# Patient Record
Sex: Female | Born: 1955 | Race: Black or African American | Hispanic: No | Marital: Married | Smoking: Former smoker
Health system: Southern US, Community
[De-identification: ages and names within clinical notes are randomized; demographics above are authoritative.]

## PROBLEM LIST (undated history)

## (undated) DIAGNOSIS — I10 Essential (primary) hypertension: Secondary | ICD-10-CM

## (undated) DIAGNOSIS — D649 Anemia, unspecified: Secondary | ICD-10-CM

## (undated) DIAGNOSIS — E559 Vitamin D deficiency, unspecified: Secondary | ICD-10-CM

## (undated) DIAGNOSIS — M549 Dorsalgia, unspecified: Secondary | ICD-10-CM

## (undated) HISTORY — DX: Essential (primary) hypertension: I10

## (undated) HISTORY — PX: NO PAST SURGERIES: SHX2092

## (undated) HISTORY — DX: Dorsalgia, unspecified: M54.9

## (undated) HISTORY — DX: Anemia, unspecified: D64.9

## (undated) HISTORY — DX: Vitamin D deficiency, unspecified: E55.9

---

## 2011-09-16 ENCOUNTER — Emergency Department: Payer: Self-pay | Admitting: Emergency Medicine
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
## 2019-01-15 ENCOUNTER — Emergency Department
Admission: EM | Admit: 2019-01-15 | Discharge: 2019-01-15 | Disposition: A | Discharge status: Home / Self Care | Payer: Self-pay | Attending: Emergency Medicine | Admitting: Emergency Medicine

## 2019-01-15 ENCOUNTER — Other Ambulatory Visit: Payer: Self-pay

## 2019-01-15 ENCOUNTER — Encounter: Payer: Self-pay | Admitting: Emergency Medicine

## 2019-01-15 DIAGNOSIS — L2489 Irritant contact dermatitis due to other agents: Secondary | ICD-10-CM | POA: Insufficient documentation

## 2019-01-15 MED ORDER — MUPIROCIN 2 % EX OINT: TOPICAL_OINTMENT | CUTANEOUS | 0 refills | Status: AC

## 2019-01-15 MED ORDER — PREDNISONE 10 MG (21) PO TBPK: ORAL_TABLET | ORAL | 0 refills | Status: DC

## 2019-01-15 NOTE — ED Triage Notes (Signed)
Pt reports itching and burning to both of her eyes for a week. Pt reports was using a lotion for the irritation as recommenced by the pharmacy but not helping

## 2019-01-15 NOTE — ED Provider Notes (Signed)
[facility] Emergency Department Provider Note  ____________________________________________  Time seen: Approximately 4:27 PM  I have reviewed the triage vital signs and the nursing notes.   HISTORY  Chief Complaint Eye Drainage   HPI Stephanie Marsh is a 63 y.o. female presents to the ER for itching and burning around both eyes for the past week. Symptoms started after wearing a mask while at work. Area is itching and red. No vision changes or drainage from eyes. No fever or other symptoms of concern.    History reviewed. No pertinent past medical history.  There are no problems to display for this patient.   History reviewed. No pertinent surgical history.  Prior to Admission medications   Medication Sig Start Date End Date Taking? Authorizing Provider  mupirocin ointment (BACTROBAN) 2 % Apply to affected area 3 times daily 01/15/19 01/15/20  Jamise Pentland B, FNP  predniSONE (STERAPRED UNI-PAK 21 TAB) 10 MG (21) TBPK tablet Take 6 tablets on the first day and decrease by 1 tablet each day until finished. 01/15/19   Chinita Pester, FNP    Allergies Patient has no known allergies.  No family history on file.  Social History Social History   Tobacco Use  . Smoking status: Not on file  Substance Use Topics  . Alcohol use: Not on file  . Drug use: Not on file    Review of Systems  Constitutional: Negative for fever. Respiratory: Negative for cough or shortness of breath.  Musculoskeletal: Negative for myalgias Skin: Positive for facial erythema. Neurological: Negative for numbness or paresthesias. ____________________________________________   PHYSICAL EXAM:  VITAL SIGNS: ED Triage Vitals  Enc Vitals Group     BP 01/15/19 1400 (!) 176/82     Pulse Rate 01/15/19 1400 73     Resp 01/15/19 1400 16     Temp 01/15/19 1400 98.4 F (36.9 C)     Temp Source 01/15/19 1400 Oral     SpO2 01/15/19 1400 99 %     Weight 01/15/19 1347 160 lb  (72.6 kg)     Height 01/15/19 1347 4\' 9"  (1.448 m)     Head Circumference --      Peak Flow --      Pain Score 01/15/19 1347 0     Pain Loc --      Pain Edu? --      Excl. in GC? --      Constitutional: Overall well appearing. Eyes: Conjunctivae are clear without discharge or drainage. Nose: No rhinorrhea noted. Mouth/Throat: Airway is patent.  Neck: No stridor. Unrestricted range of motion observed. Cardiovascular: Capillary refill is <3 seconds.  Respiratory: Respirations are even and unlabored.. Musculoskeletal: Unrestricted range of motion observed. Neurologic: Awake, alert, and oriented x 4.  Skin:  Skin erythema noted around eyes and in masked area of face.  ____________________________________________   LABS (all labs ordered are listed, but only abnormal results are displayed)  Labs Reviewed - No data to display ____________________________________________  EKG  Not indicated. ____________________________________________  RADIOLOGY  Not indicated. ____________________________________________   PROCEDURES  Procedures ____________________________________________   INITIAL IMPRESSION / ASSESSMENT AND PLAN / ED COURSE  SYDNEY HASTEN is a 63 y.o. female who presents to the emergency department for treatment and evaluation of facial erythema and skin irritation. She has been using Aquaphor without relief. Exam is concerning for contact dermatitis. She will be prescribed prednisone and mupirocin ointment. She is to see the [facility] faculty clinic if not improving over  the next few days. If symptoms worsen, she it to return to the ER.   Medications - No data to display   Pertinent labs & imaging results that were available during my care of the patient were reviewed by me and considered in my medical decision making (see chart for details).  ____________________________________________   FINAL CLINICAL IMPRESSION(S) / ED DIAGNOSES  Final diagnoses:  Irritant  contact dermatitis due to other agents    ED Discharge Orders         Ordered    predniSONE (STERAPRED UNI-PAK 21 TAB) 10 MG (21) TBPK tablet     01/15/19 1523    mupirocin ointment (BACTROBAN) 2 %     01/15/19 1523           Note:  This document was prepared using Dragon voice recognition software and may include unintentional dictation errors.   Victorino Dike, FNP 01/15/19 5329    Lavonia Drafts, MD 01/16/19 [phone]

## 2019-01-15 NOTE — ED Notes (Signed)
Pt seen in room 3 of [facility].  Discharge from [facility].

## 2019-01-15 NOTE — Discharge Instructions (Signed)
Please see primary care or return to the ER for any sign or concern of infection.  It appears that the skin irritation is related to wearing a face mask. If possible, please wear shield instead.

## 2019-02-08 ENCOUNTER — Emergency Department
Admission: EM | Admit: 2019-02-08 | Discharge: 2019-02-08 | Disposition: A | Discharge status: Home / Self Care | Payer: BC Managed Care – PPO | Attending: Emergency Medicine | Admitting: Emergency Medicine

## 2019-02-08 ENCOUNTER — Other Ambulatory Visit: Payer: Self-pay

## 2019-02-08 DIAGNOSIS — Z79899 Other long term (current) drug therapy: Secondary | ICD-10-CM | POA: Diagnosis not present

## 2019-02-08 DIAGNOSIS — R21 Rash and other nonspecific skin eruption: Secondary | ICD-10-CM | POA: Diagnosis present

## 2019-02-08 DIAGNOSIS — L249 Irritant contact dermatitis, unspecified cause: Secondary | ICD-10-CM | POA: Diagnosis not present

## 2019-02-08 MED ORDER — PREDNISONE 10 MG PO TABS: ORAL_TABLET | ORAL | 0 refills | Status: DC

## 2019-02-08 NOTE — ED Provider Notes (Signed)
[facility] Emergency Department Provider Note  ____________________________________________  Time seen: Approximately 6:35 PM  I have reviewed the triage vital signs and the nursing notes.   HISTORY  Chief Complaint Rash    HPI Stephanie Marsh is a 64 y.o. female that presents to the emergency department for evaluation of rash to cheeks and around eyes for several days. Rash itches and burns.  No fevers.  Patient states that rash started after she started wearing cloth masks at her second job. She does not seem to have a problem with the paper masks, but she is required to wear these cloth masks.  She bought some clips at [facility] today to help keep the mask off of her skin.   At her first job she is able to wear glasses with a face shield.  She also wears reading glasses daily, which do not touch her face and she has had these glasses for several years.  Her face shield does not touch her face either. Patient had the same rash in December and was evaluated in the emergency department.  She was given a prescription for prednisone and new suppressant ointment and rash cleared.  She finished the prednisone on January 4.    History reviewed. No pertinent past medical history.  There are no problems to display for this patient.   History reviewed. No pertinent surgical history.  Prior to Admission medications   Medication Sig Start Date End Date Taking? Authorizing Provider  mupirocin ointment (BACTROBAN) 2 % Apply to affected area 3 times daily 01/15/19 01/15/20  Triplett, Rulon Eisenmenger B, FNP  predniSONE (DELTASONE) 10 MG tablet Take 6 tablets on day 1, take 5 tablets on day 2, take 4 tablets on day 3, take 3 tablets on day 4, take 2 tablets on day 5, take 1 tablet on day 6 02/08/19   Enid Derry, PA-C    Allergies Patient has no known allergies.  No family history on file.  Social History Social History   Tobacco Use  . Smoking status: Never Smoker  . Smokeless  tobacco: Never Used  Substance Use Topics  . Alcohol use: Never  . Drug use: Never     Review of Systems  Constitutional: No fever/chills Gastrointestinal:  No nausea, no vomiting.  Musculoskeletal: Negative for musculoskeletal pain. Skin: Negative for abrasions, lacerations, ecchymosis. Positive for rash. Neurological: Negative for headaches   ____________________________________________   PHYSICAL EXAM:  VITAL SIGNS: ED Triage Vitals  Enc Vitals Group     BP 02/08/19 1711 (!) 142/70     Pulse Rate 02/08/19 1711 77     Resp 02/08/19 1711 18     Temp 02/08/19 1711 98.6 F (37 C)     Temp Source 02/08/19 1711 Oral     SpO2 02/08/19 1711 97 %     Weight 02/08/19 1710 160 lb (72.6 kg)     Height 02/08/19 1710 4' 10.5" (1.486 m)     Head Circumference --      Peak Flow --      Pain Score 02/08/19 1733 0     Pain Loc --      Pain Edu? --      Excl. in GC? --      Constitutional: Alert and oriented. Well appearing and in no acute distress. Eyes: Conjunctivae are normal. PERRL. EOMI. Head: Atraumatic. ENT:      Ears:      Nose: No congestion/rhinnorhea.      Mouth/Throat: Mucous membranes  are moist.  Neck: No stridor.   Cardiovascular: Normal rate, regular rhythm.  Good peripheral circulation. Respiratory: Normal respiratory effort without tachypnea or retractions. Lungs CTAB. Good air entry to the bases with no decreased or absent breath sounds. Musculoskeletal: Full range of motion to all extremities. No gross deformities appreciated. Neurologic:  Normal speech and language. No gross focal neurologic deficits are appreciated.  Skin:  Skin is warm, dry and intact.  Erythema and dry skin to bilateral cheeks that extend around orbits.  No swelling. Psychiatric: Mood and affect are normal. Speech and behavior are normal. Patient exhibits appropriate insight and judgement.   ____________________________________________   LABS (all labs ordered are listed, but only  abnormal results are displayed)  Labs Reviewed - No data to display ____________________________________________  EKG   ____________________________________________  RADIOLOGY  No results found.  ____________________________________________    PROCEDURES  Procedure(s) performed:    Procedures    Medications - No data to display   ____________________________________________   INITIAL IMPRESSION / ASSESSMENT AND PLAN / ED COURSE  Pertinent labs & imaging results that were available during my care of the patient were reviewed by me and considered in my medical decision making (see chart for details).  Review of the [place] CSRS was performed in accordance of the [place] prior to dispensing any controlled drugs.  Patient's diagnosis is consistent with irritant dermatitis. Patient will be discharged home with prescriptions for prednisone. Patient is to follow up with dermatology as directed. Referral was given. Patient is given ED precautions to return to the ED for any worsening or new symptoms.  Stephanie Marsh was evaluated in Emergency Department on 02/08/2019 for the symptoms described in the history of present illness. She was evaluated in the context of the global COVID-19 pandemic, which necessitated consideration that the patient might be at risk for infection with the SARS-CoV-2 virus that causes COVID-19. Institutional protocols and algorithms that pertain to the evaluation of patients at risk for COVID-19 are in a state of rapid change based on information released by regulatory bodies including the CDC and federal and state organizations. These policies and algorithms were followed during the patient's care in the ED.  ____________________________________________  FINAL CLINICAL IMPRESSION(S) / ED DIAGNOSES  Final diagnoses:  Irritant dermatitis      NEW MEDICATIONS STARTED DURING THIS VISIT:  ED Discharge Orders         Ordered    predniSONE (DELTASONE) 10 MG  tablet     02/08/19 1851              This chart was dictated using voice recognition software/Dragon. Despite best efforts to proofread, errors can occur which can change the meaning. Any change was purely unintentional.    Laban Emperor, PA-C 02/08/19 1954    Carrie Mew, MD 02/08/19 [phone]

## 2019-02-08 NOTE — ED Triage Notes (Signed)
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

## 2019-07-30 ENCOUNTER — Other Ambulatory Visit: Payer: Self-pay

## 2019-07-30 ENCOUNTER — Ambulatory Visit: Payer: BC Managed Care – PPO | Admitting: Dermatology

## 2019-07-30 DIAGNOSIS — L280 Lichen simplex chronicus: Secondary | ICD-10-CM | POA: Diagnosis not present

## 2019-07-30 DIAGNOSIS — L209 Atopic dermatitis, unspecified: Secondary | ICD-10-CM | POA: Diagnosis not present

## 2019-07-30 DIAGNOSIS — L818 Other specified disorders of pigmentation: Secondary | ICD-10-CM | POA: Diagnosis not present

## 2019-07-30 DIAGNOSIS — R21 Rash and other nonspecific skin eruption: Secondary | ICD-10-CM

## 2019-07-30 DIAGNOSIS — L811 Chloasma: Secondary | ICD-10-CM

## 2019-07-30 MED ORDER — MOMETASONE FUROATE 0.1 % EX CREA: TOPICAL_CREAM | CUTANEOUS | 0 refills | Status: DC

## 2019-07-30 NOTE — Progress Notes (Signed)
   New Patient Visit  Subjective  Stephanie Marsh is a 64 y.o. female who presents for the following: rash and discoloration (Irritation of the eyebrows and the face that occurred after having to wear a facial mask about a year ago ). Patient using Benadryl and an OTC cream that has helped some.  The following portions of the chart were reviewed this encounter and updated as appropriate:  Tobacco  Allergies  Meds  Problems  Med Hx  Surg Hx  Fam Hx     Review of Systems:  No other skin or systemic complaints except as noted in HPI or Assessment and Plan.  Objective  Well appearing patient in no apparent distress; mood and affect are within normal limits.  A focused examination was performed including the face. Relevant physical exam findings are noted in the Assessment and Plan.  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 Then decrease to QD 5d/wk until follow up appointment in 6 weeks. Discoloration will fade with time. Recommend mild soaps and moisturizers daily like Dove and CeraVe. Do not use other products on the face other than recommended products.  mometasone (ELOCON) 0.1 % cream - Face  Melasma Face  Vs PIPA from scratching face May consider topical bleaching creams in the future if necessary.  Return in about 6 weeks (around 09/10/2019).  Maylene Roes, CMA, am acting as scribe for Armida Sans, MD .  Documentation: I have reviewed the above documentation for accuracy and completeness, and I agree with the above.  Armida Sans, MD

## 2019-08-03 ENCOUNTER — Encounter: Payer: Self-pay | Admitting: Dermatology

## 2019-10-06 ENCOUNTER — Ambulatory Visit: Payer: BC Managed Care – PPO | Admitting: Dermatology

## 2021-04-17 ENCOUNTER — Encounter: Payer: Self-pay | Admitting: Intensive Care

## 2021-04-17 ENCOUNTER — Other Ambulatory Visit: Payer: Self-pay

## 2021-04-17 ENCOUNTER — Emergency Department
Admission: EM | Admit: 2021-04-17 | Discharge: 2021-04-17 | Disposition: A | Discharge status: Home / Self Care | Payer: BC Managed Care – PPO | Attending: Emergency Medicine | Admitting: Emergency Medicine

## 2021-04-17 DIAGNOSIS — M545 Low back pain, unspecified: Secondary | ICD-10-CM | POA: Diagnosis not present

## 2021-04-17 DIAGNOSIS — I10 Essential (primary) hypertension: Secondary | ICD-10-CM | POA: Insufficient documentation

## 2021-04-17 DIAGNOSIS — G8929 Other chronic pain: Secondary | ICD-10-CM | POA: Diagnosis not present

## 2021-04-17 MED ORDER — CYCLOBENZAPRINE HCL 10 MG PO TABS: 10.0000 mg | ORAL_TABLET | Freq: Three times a day (TID) | ORAL | 0 refills | Status: AC | PRN

## 2021-04-17 MED ORDER — LIDOCAINE 5 % EX PTCH: 1.0000 | MEDICATED_PATCH | Freq: Two times a day (BID) | CUTANEOUS | 0 refills | Status: AC

## 2021-04-17 NOTE — ED Notes (Signed)
Patient declined discharge vital signs. Patient states she will use her husband's blood pressure cuff to monitor her blood pressure. ?

## 2021-04-17 NOTE — ED Provider Notes (Signed)
? ?[facility] ?Provider Note ? ? ? Event Date/Time  ? First MD Initiated Contact with Patient 04/17/21 1510   ?  (approximate) ? ? ?History  ? ?Chief Complaint ?No chief complaint on file. ? ? ?HPI ?Stephanie Marsh is a 66 y.o. female, no remarkable medical history, presents the emergency department for evaluation of low back pain.  Patient states she has been experiencing back pain along the lower left side of her back for the past 2 to 3 weeks.  Denies any recent injuries or heavy lifting.  She states that she has had back pain like this in the past, however she states that it comes and goes.  She has not taken any medication for this yet.  Denies fever/chills, abdominal pain, urinary symptoms, headache, nausea/vomiting, flank pain, numbness/tingling in upper or lower extremities, rashes, or lightheadedness/dizziness. ? ?History Limitations: No limitations. ? ?  ? ? ?Physical Exam  ?Triage Vital Signs: ?ED Triage Vitals  ?Enc Vitals Group  ?   BP 04/17/21 1455 (!) 173/85  ?   Pulse Rate 04/17/21 1455 90  ?   Resp 04/17/21 1455 16  ?   Temp 04/17/21 1455 (!) 97 ?F (36.1 ?C)  ?   Temp Source 04/17/21 1455 Oral  ?   SpO2 04/17/21 1455 97 %  ?   Weight 04/17/21 1456 150 lb (68 kg)  ?   Height 04/17/21 1456 4' 10.5" (1.486 m)  ?   Head Circumference --   ?   Peak Flow --   ?   Pain Score 04/17/21 1456 2  ?   Pain Loc --   ?   Pain Edu? --   ?   Excl. in GC? --   ? ? ?Most recent vital signs: ?Vitals:  ? 04/17/21 1455  ?BP: (!) 173/85  ?Pulse: 90  ?Resp: 16  ?Temp: (!) 97 ?F (36.1 ?C)  ?SpO2: 97%  ? ? ?General: Awake, NAD.  ?Skin: Warm, dry.  ?CV: Good peripheral perfusion.  ?Resp: Normal effort.  ?Abd: Soft, non-tender. No distention.  ?Neuro: At baseline. No gross neurological deficits.  ?Other: No midline spinal tenderness.  Mild paraspinal tightness appreciated along the left side of the patient's lumbar region.  Negative straight leg test.  Pulse, motor, sensation intact distally in both  extremities.  Patient is able to ambulate on her own without assistance. ? ?Physical Exam ? ? ? ?ED Results / Procedures / Treatments  ?Labs ?(all labs ordered are listed, but only abnormal results are displayed) ?Labs Reviewed - No data to display ? ? ?EKG ?Not applicable. ? ? ?RADIOLOGY ? ?ED Provider Interpretation: Not applicable. ? ?No results found. ? ?PROCEDURES: ? ?Critical Care performed: None. ? ?Procedures ? ? ? ?MEDICATIONS ORDERED IN ED: ?Medications - No data to display ? ? ?IMPRESSION / MDM / ASSESSMENT AND PLAN / ED COURSE  ?I reviewed the triage vital signs and the nursing notes. ?             ?               ? ?Differential diagnosis includes, but is not limited to, lumbosacral strain, lumbar spine injury, sciatica. ? ?ED Course ?Patient appears well.  Hypertensive at 173/85, otherwise normal vitals.  NAD.  She states that she has not needed any pain medication at this time. ? ?Assessment/Plan ?Presentation consistent with musculoskeletal back pain, predominantly in the left paraspinal lumbar region.  No evidence of spinal injury.  Imaging  unlikely to be helpful in this scenario.  Offered the patient pain medication at this time, however she states that she is happy getting a prescription to take home.  We will provide her with a prescription for cyclobenzaprine and lidocaine patches.  Encouraged her to follow-up with her primary care provider for ongoing management.  Patient expressed understanding and agreed with the plan.  We will plan to discharge ? ?Patient was provided with anticipatory guidance, return precautions, and educational material. Encouraged the patient to return to the emergency department at any time if they begin to experience any new or worsening symptoms.  ? ?  ? ? ?FINAL CLINICAL IMPRESSION(S) / ED DIAGNOSES  ? ?Final diagnoses:  ?Chronic left-sided low back pain without sciatica  ? ? ? ?Rx / DC Orders  ? ?ED Discharge Orders   ? ?      Ordered  ?  cyclobenzaprine (FLEXERIL)  10 MG tablet  3 times daily PRN       ? 04/17/21 1518  ?  lidocaine (LIDODERM) 5 %  Every 12 hours       ? 04/17/21 1518  ? ?  ?  ? ?  ? ? ? ?Note:  This document was prepared using Dragon voice recognition software and may include unintentional dictation errors. ?  ?Varney Daily, [place] ?04/17/21 1536 ? ?  ?Georga Hacking, MD ?04/17/21 1640 ? ?

## 2021-04-17 NOTE — ED Triage Notes (Signed)
Patient c/o lower back pain for a couple weeks. Denies radiation. Denies injury. No problems urinating ?

## 2021-04-17 NOTE — Discharge Instructions (Addendum)
-  You may treat pain with Tylenol/ibuprofen as needed.  You may additionally take cyclobenzaprine as a muscle relaxer, as well as utilize lidocaine patches.  Please review the educational material provided. ?-Please establish and follow-up with a primary care provider for ongoing management. ?-Return to the emergency department anytime if you begin to experience any new or worsening symptoms. ?

## 2021-05-02 ENCOUNTER — Emergency Department
Admission: EM | Admit: 2021-05-02 | Discharge: 2021-05-02 | Disposition: A | Discharge status: Home / Self Care | Payer: BC Managed Care – PPO | Attending: Emergency Medicine | Admitting: Emergency Medicine

## 2021-05-02 ENCOUNTER — Encounter: Payer: Self-pay | Admitting: Emergency Medicine

## 2021-05-02 ENCOUNTER — Other Ambulatory Visit: Payer: Self-pay

## 2021-05-02 DIAGNOSIS — R030 Elevated blood-pressure reading, without diagnosis of hypertension: Secondary | ICD-10-CM

## 2021-05-02 DIAGNOSIS — R519 Headache, unspecified: Secondary | ICD-10-CM

## 2021-05-02 DIAGNOSIS — I10 Essential (primary) hypertension: Secondary | ICD-10-CM | POA: Diagnosis not present

## 2021-05-02 DIAGNOSIS — R778 Other specified abnormalities of plasma proteins: Secondary | ICD-10-CM | POA: Diagnosis not present

## 2021-05-02 DIAGNOSIS — G8929 Other chronic pain: Secondary | ICD-10-CM | POA: Diagnosis not present

## 2021-05-02 DIAGNOSIS — Z7982 Long term (current) use of aspirin: Secondary | ICD-10-CM | POA: Insufficient documentation

## 2021-05-02 MED ORDER — HYDROCHLOROTHIAZIDE 25 MG PO TABS: 25.0000 mg | ORAL_TABLET | Freq: Every day | ORAL | 1 refills | Status: DC

## 2021-05-02 MED ORDER — LIDOCAINE 5 % EX PTCH: 1.0000 | MEDICATED_PATCH | Freq: Two times a day (BID) | CUTANEOUS | 0 refills | Status: DC

## 2021-05-02 NOTE — ED Notes (Signed)
See triage note  presents with cont'd lower back pain  states pain started about 2 weeks ago   was seen last week  but states pain cont's  states pain is non radiating  ..ambulates well to treatment room  also denies any urinary sxs'   also has noticed that her b/p has been up   denies taking any meds for same ?

## 2021-05-02 NOTE — ED Triage Notes (Signed)
Pt in co headache x 1 week, states also having lower back here seen here for the same recently.  ?

## 2021-05-02 NOTE — ED Provider Notes (Signed)
? ?[facility] ?Provider Note ? ? ? Event Date/Time  ? First MD Initiated Contact with Patient 05/02/21 [phone]   ?  (approximate) ? ? ?History  ? ?Headache ? ? ?HPI ? ?Stephanie Marsh is a 66 y.o. female   presents to the ED with complaint of frontal headache for 1 week.  Patient states that she takes either 1 aspirin or 1 ibuprofen a day and gets relief of her headache.  She denies any visual changes, nausea or vomiting with this.  Patient has a history of hypertension but states that she has not taken medication "a long time" because the bottle ran out.  Patient currently does not have a PCP.  Patient also reports continued problems with her back but has been unable to take the muscle relaxant as she works 2 jobs.  She was seen on 04/17/2021 for low back pain without history of injury.  Patient was discharged with a prescription for Lidoderm patch and Flexeril.   ? ?  ? ? ?Physical Exam  ? ?Triage Vital Signs: ?ED Triage Vitals  ?Enc Vitals Group  ?   BP 05/02/21 0750 (!) 174/84  ?   Pulse Rate 05/02/21 0750 69  ?   Resp 05/02/21 0750 14  ?   Temp 05/02/21 0750 98.2 ?F (36.8 ?C)  ?   Temp Source 05/02/21 0750 Oral  ?   SpO2 05/02/21 0750 98 %  ?   Weight 05/02/21 0748 160 lb (72.6 kg)  ?   Height 05/02/21 0748 4\' 10"  (1.473 m)  ?   Head Circumference --   ?   Peak Flow --   ?   Pain Score 05/02/21 0748 3  ?   Pain Loc --   ?   Pain Edu? --   ?   Excl. in GC? --   ? ? ?Most recent vital signs: ?Vitals:  ? 05/02/21 0808 05/02/21 0847  ?BP: (!) 189/87 (!) 172/72  ?Pulse: 70 70  ?Resp: 16 16  ?Temp:    ?SpO2: 98% 98%  ? ? ? ?General: Awake, no distress.  Able to speak in complete sentences without any difficulty.  No photophobia. ?CV:  Good peripheral perfusion.  Heart regular rate and rhythm. ?Resp:  Normal effort.  Lungs are clear bilaterally. ?Abd:  No distention.  ?Other:  PERRLA, EOMI's, cranial nerves II through XII grossly intact.  Good muscle strength bilaterally.  Patient is ambulatory without  any assistance.  Patient complaint included back pain however patient states that when she was seen here prior a Lidoderm patch was applied to her back but a prescription for the patches was not sent to her pharmacy.  Patient is able to sit, stand and ambulate without any assistance.  No muscle spasms are noted.  No point tenderness palpated. ? ? ?ED Results / Procedures / Treatments  ? ?Labs ?(all labs ordered are listed, but only abnormal results are displayed) ?Labs Reviewed - No data to display ? ? ? ? ?PROCEDURES: ? ?Critical Care performed:  ? ?Procedures ? ? ?MEDICATIONS ORDERED IN ED: ?Medications - No data to display ? ? ?IMPRESSION / MDM / ASSESSMENT AND PLAN / ED COURSE  ?I reviewed the triage vital signs and the nursing notes. ? ? ?Differential diagnosis includes, but is not limited to, chronic intermittent frontal headache, resolving back pain, elevated blood pressure, ? ?66 year old female presents to the ED with complaint of frontal headache for over 1 week.  Patient reports that she  takes either 1 aspirin a day or 1 ibuprofen with relief of her headache.  She denies any visual changes, vomiting, dizziness or visual changes during this time.  It was noted during her triage and also during the time of her stay in the emergency department that her blood pressure was elevated.  When I spoke with patient she states she has never been on any blood pressure medication but states she was on blood medicine which after talking with her was during her teenage years which most likely had to do with anemia and not hypertension.  We will place her on hydrochlorothiazide 25 mg 1 daily and she is strongly encouraged to have her blood pressure checked hopefully twice a week.  We discussed the employee clinic at [facility] and that she is eligible to have her blood pressure checked there for free.  I also gave her a list of offices in the area that she can call and establish a primary care provider.  A  prescription for Lidoderm patches was sent to the pharmacy along with her hydrochlorothiazide. ? ? ? ?FINAL CLINICAL IMPRESSION(S) / ED DIAGNOSES  ? ?Final diagnoses:  ?Elevated blood pressure reading  ?Nonintractable headache, unspecified chronicity pattern, unspecified headache type  ? ? ? ?Rx / DC Orders  ? ?ED Discharge Orders   ? ?      Ordered  ?  lidocaine (LIDODERM) 5 %  Every 12 hours       ? 05/02/21 0917  ?  hydrochlorothiazide (HYDRODIURIL) 25 MG tablet  Daily       ? 05/02/21 0973  ? ?  ?  ? ?  ? ? ? ?Note:  This document was prepared using Dragon voice recognition software and may include unintentional dictation errors. ?  ?Tommi Rumps, PA-C ?05/02/21 1540 ? ?  ?Jene Every, MD ?05/02/21 1553 ? ?

## 2021-05-02 NOTE — Discharge Instructions (Addendum)
Call the employee clinic on the [facility] and make an appointment to have your blood pressure checked once a week and also have lab work done.  A list of doctors is on your discharge papers so that you may obtain a primary care doctor.  Begin taking the medication for your blood pressure which may cause some increased urination on for several days.  Also the Lidoderm patches were sent to the pharmacy as well. ?

## 2021-05-30 ENCOUNTER — Encounter: Payer: Self-pay | Admitting: Medical

## 2021-05-30 ENCOUNTER — Ambulatory Visit: Payer: Self-pay | Admitting: Medical

## 2021-05-30 VITALS — BP 176/82 | HR 80 | Temp 98.8°F | Resp 18

## 2021-05-30 DIAGNOSIS — I10 Essential (primary) hypertension: Secondary | ICD-10-CM

## 2021-05-30 MED ORDER — HYDROCHLOROTHIAZIDE 25 MG PO TABS: 25.0000 mg | ORAL_TABLET | Freq: Every day | ORAL | 3 refills | Status: DC

## 2021-05-30 NOTE — Progress Notes (Signed)
? ?  Subjective:  ? ? Patient ID: Stephanie Marsh, female    DOB: January 31, 1955, 66 y.o.   MRN: [id number] ? ?HPI ? ?66 yo female in non acute distress. Presents today with history of being seen in the [facility] for HA, and low back pain.  ? Placed on  HCTZ 25mg  /day 2 weeks of medication was prescribed.  ? ?She tried the Flexeril but it made her heart race and made her sleepy. So she could not work her second job. ?She states she took her last HCTZ today. ?And her blood pressure is elevated because she is scared to see the doctor. ?She checks her blood pressure at home and lately it has run  130's to 133/ 78-80. ? ?She says her right lower back is better but she can still tell it is there. ? ?She has no PCP. ?Review of Systems  ?Constitutional:  Negative for chills and fever.  ?Respiratory:  Negative for cough and shortness of breath.   ?Cardiovascular:  Negative for chest pain.  ?Musculoskeletal:  Positive for back pain (lower right side).  ?Neurological:  Negative for dizziness and headaches.  ? ?   ?Objective:  ? Physical Exam ?Vitals and nursing note reviewed.  ?Constitutional:   ?   Appearance: Normal appearance.  ?HENT:  ?   Head: Normocephalic and atraumatic.  ?Cardiovascular:  ?   Rate and Rhythm: Normal rate and regular rhythm.  ?   Heart sounds: Normal heart sounds.  ?Pulmonary:  ?   Effort: Pulmonary effort is normal.  ?   Breath sounds: Normal breath sounds.  ?Musculoskeletal:     ?   General: Normal range of motion.  ?   Cervical back: Normal range of motion.  ?Skin: ?   General: Skin is warm and dry.  ?Neurological:  ?   Mental Status: She is alert and oriented to person, place, and time.  ? ? ? ? ? ?   ?Assessment & Plan:  ?Hypertension ?Meds ordered this encounter  ?Medications  ? hydrochlorothiazide (HYDRODIURIL) 25 MG tablet  ?  Sig: Take 1 tablet (25 mg total) by mouth daily.  ?  Dispense:  90 tablet  ?  Refill:  3  ? Prescription for  30 tablets and no refills, called  pharmacy. ?Orders Placed This Encounter  ?Procedures  ? VITAMIN D 25 Hydroxy (Vit-D Deficiency, Fractures)  ?  Standing Status:   Future  ?  Standing Expiration Date:   05/31/2022  ? CMP12+LP+TP+TSH+6AC+CBC/D/Plt  ?  Standing Status:   Future  ?  Standing Expiration Date:   05/31/2022  ? Given information for primary care provider. ?Patient will return for blood work. ?She verbalizes understanding and has no questions at discharge. ? ? ?

## 2021-05-30 NOTE — Patient Instructions (Signed)
Hypertension, Adult ?Hypertension is another name for high blood pressure. High blood pressure forces your heart to work harder to pump blood. This can cause problems over time. ?There are two numbers in a blood pressure reading. There is a top number (systolic) over a bottom number (diastolic). It is best to have a blood pressure that is below 120/80. ?What are the causes? ?The cause of this condition is not known. Some other conditions can lead to high blood pressure. ?What increases the risk? ?Some lifestyle factors can make you more likely to develop high blood pressure: ?Smoking. ?Not getting enough exercise or physical activity. ?Being overweight. ?Having too much fat, sugar, calories, or salt (sodium) in your diet. ?Drinking too much alcohol. ?Other risk factors include: ?Having any of these conditions: ?Heart disease. ?Diabetes. ?High cholesterol. ?Kidney disease. ?Obstructive sleep apnea. ?Having a family history of high blood pressure and high cholesterol. ?Age. The risk increases with age. ?Stress. ?What are the signs or symptoms? ?High blood pressure may not cause symptoms. Very high blood pressure (hypertensive crisis) may cause: ?Headache. ?Fast or uneven heartbeats (palpitations). ?Shortness of breath. ?Nosebleed. ?Vomiting or feeling like you may vomit (nauseous). ?Changes in how you see. ?Very bad chest pain. ?Feeling dizzy. ?Seizures. ?How is this treated? ?This condition is treated by making healthy lifestyle changes, such as: ?Eating healthy foods. ?Exercising more. ?Drinking less alcohol. ?Your doctor may prescribe medicine if lifestyle changes do not help enough and if: ?Your top number is above 130. ?Your bottom number is above 80. ?Your personal target blood pressure may vary. ?Follow these instructions at home: ?Eating and drinking ? ?If told, follow the DASH eating plan. To follow this plan: ?Fill one half of your plate at each meal with fruits and vegetables. ?Fill one fourth of your plate  at each meal with whole grains. Whole grains include whole-wheat pasta, brown rice, and whole-grain bread. ?Eat or drink low-fat dairy products, such as skim milk or low-fat yogurt. ?Fill one fourth of your plate at each meal with low-fat (lean) proteins. Low-fat proteins include fish, chicken without skin, eggs, beans, and tofu. ?Avoid fatty meat, cured and processed meat, or chicken with skin. ?Avoid pre-made or processed food. ?Limit the amount of salt in your diet to less than 1,500 mg each day. ?Do not drink alcohol if: ?Your doctor tells you not to drink. ?You are pregnant, may be pregnant, or are planning to become pregnant. ?If you drink alcohol: ?Limit how much you have to: ?0-1 drink a day for women. ?0-2 drinks a day for men. ?Know how much alcohol is in your drink. In the [place] one drink equals one 12 oz bottle of beer (355 mL), one 5 oz glass of wine (148 mL), or one 1? oz glass of hard liquor (44 mL). ?Lifestyle ? ?Work with your doctor to stay at a healthy weight or to lose weight. Ask your doctor what the best weight is for you. ?Get at least 30 minutes of exercise that causes your heart to beat faster (aerobic exercise) most days of the week. This may include walking, swimming, or biking. ?Get at least 30 minutes of exercise that strengthens your muscles (resistance exercise) at least 3 days a week. This may include lifting weights or doing Pilates. ?Do not smoke or use any products that contain nicotine or tobacco. If you need help quitting, ask your doctor. ?Check your blood pressure at home as told by your doctor. ?Keep all follow-up visits. ?Medicines ?Take over-the-counter and prescription medicines   only as told by your doctor. Follow directions carefully. ?Do not skip doses of blood pressure medicine. The medicine does not work as well if you skip doses. Skipping doses also puts you at risk for problems. ?Ask your doctor about side effects or reactions to medicines that you should watch  for. ?Contact a doctor if: ?You think you are having a reaction to the medicine you are taking. ?You have headaches that keep coming back. ?You feel dizzy. ?You have swelling in your ankles. ?You have trouble with your vision. ?Get help right away if: ?You get a very bad headache. ?You start to feel mixed up (confused). ?You feel weak or numb. ?You feel faint. ?You have very bad pain in your: ?Chest. ?Belly (abdomen). ?You vomit more than once. ?You have trouble breathing. ?These symptoms may be an emergency. Get help right away. Call 911. ?Do not wait to see if the symptoms will go away. ?Do not drive yourself to the hospital. ?Summary ?Hypertension is another name for high blood pressure. ?High blood pressure forces your heart to work harder to pump blood. ?For most people, a normal blood pressure is less than 120/80. ?Making healthy choices can help lower blood pressure. If your blood pressure does not get lower with healthy choices, you may need to take medicine. ?This information is not intended to replace advice given to you by your health care provider. Make sure you discuss any questions you have with your health care provider. ?Document Revised: 10/21/2020 Document Reviewed: 10/21/2020 ?Elsevier Patient Education ? 2023 Elsevier Inc. ? ?

## 2021-06-01 ENCOUNTER — Other Ambulatory Visit: Payer: Self-pay

## 2021-06-01 DIAGNOSIS — I10 Essential (primary) hypertension: Secondary | ICD-10-CM

## 2021-06-02 LAB — CMP12+LP+TP+TSH+6AC+CBC/D/PLT
ALT: 15 IU/L (ref 0–32)
AST: 16 IU/L (ref 0–40)
Albumin/Globulin Ratio: 1.6 (ref 1.2–2.2)
Albumin: 4.3 g/dL (ref 3.8–4.8)
Alkaline Phosphatase: 68 IU/L (ref 44–121)
BUN/Creatinine Ratio: 24 (ref 12–28)
BUN: 16 mg/dL (ref 8–27)
Basophils Absolute: 0 10*3/uL (ref 0.0–0.2)
Basos: 0 %
Bilirubin Total: 0.3 mg/dL (ref 0.0–1.2)
Calcium: 9.1 mg/dL (ref 8.7–10.3)
Chloride: 102 mmol/L (ref 96–106)
Chol/HDL Ratio: 4 ratio (ref 0.0–4.4)
Cholesterol, Total: 194 mg/dL (ref 100–199)
Creatinine, Ser: 0.67 mg/dL (ref 0.57–1.00)
EOS (ABSOLUTE): 0 10*3/uL (ref 0.0–0.4)
Eos: 1 %
Estimated CHD Risk: 0.9 times avg. (ref 0.0–1.0)
Free Thyroxine Index: 2.4 (ref 1.2–4.9)
GGT: 18 IU/L (ref 0–60)
Globulin, Total: 2.7 g/dL (ref 1.5–4.5)
Glucose: 93 mg/dL (ref 70–99)
HDL: 48 mg/dL (ref >39)
Hematocrit: 37.1 % (ref 34.0–46.6)
Hemoglobin: 12.1 g/dL (ref 11.1–15.9)
Immature Grans (Abs): 0 10*3/uL (ref 0.0–0.1)
Immature Granulocytes: 0 %
Iron: 59 ug/dL (ref 27–139)
LDH: 212 IU/L (ref 119–226)
LDL Chol Calc (NIH): 130 mg/dL — ABNORMAL HIGH (ref 0–99)
Lymphocytes Absolute: 2.2 10*3/uL (ref 0.7–3.1)
Lymphs: 31 %
MCH: 27.7 pg (ref 26.6–33.0)
MCHC: 32.6 g/dL (ref 31.5–35.7)
MCV: 85 fL (ref 79–97)
Monocytes Absolute: 0.4 10*3/uL (ref 0.1–0.9)
Monocytes: 6 %
Neutrophils Absolute: 4.4 10*3/uL (ref 1.4–7.0)
Neutrophils: 62 %
Phosphorus: 3.3 mg/dL (ref 3.0–4.3)
Platelets: 232 10*3/uL (ref 150–450)
Potassium: 3.7 mmol/L (ref 3.5–5.2)
RBC: 4.37 x10E6/uL (ref 3.77–5.28)
RDW: 12.4 % (ref 11.7–15.4)
Sodium: 141 mmol/L (ref 134–144)
T3 Uptake Ratio: 27 % (ref 24–39)
T4, Total: 8.9 ug/dL (ref 4.5–12.0)
TSH: 1.3 u[IU]/mL (ref 0.450–4.500)
Total Protein: 7 g/dL (ref 6.0–8.5)
Triglycerides: 88 mg/dL (ref 0–149)
Uric Acid: 7.2 mg/dL (ref 3.0–7.2)
VLDL Cholesterol Cal: 16 mg/dL (ref 5–40)
WBC: 7.2 10*3/uL (ref 3.4–10.8)
eGFR: 96 mL/min/{1.73_m2} (ref >59)

## 2021-06-02 LAB — VITAMIN D 25 HYDROXY (VIT D DEFICIENCY, FRACTURES): Vit D, 25-Hydroxy: 9.7 ng/mL — ABNORMAL LOW (ref 30.0–100.0)

## 2021-06-07 ENCOUNTER — Ambulatory Visit: Payer: Self-pay | Admitting: Medical

## 2021-06-09 ENCOUNTER — Ambulatory Visit: Payer: Self-pay | Admitting: Nurse Practitioner

## 2021-06-09 VITALS — BP 168/88 | HR 86 | Temp 97.5°F | Wt 154.8 lb

## 2021-06-09 DIAGNOSIS — I10 Essential (primary) hypertension: Secondary | ICD-10-CM

## 2021-06-09 DIAGNOSIS — J301 Allergic rhinitis due to pollen: Secondary | ICD-10-CM

## 2021-06-09 DIAGNOSIS — E559 Vitamin D deficiency, unspecified: Secondary | ICD-10-CM

## 2021-06-09 MED ORDER — LORATADINE 10 MG PO TABS: 10.0000 mg | ORAL_TABLET | Freq: Every day | ORAL | 11 refills | Status: DC

## 2021-06-09 MED ORDER — VITAMIN D (ERGOCALCIFEROL) 1.25 MG (50000 UNIT) PO CAPS: 50000.0000 [IU] | ORAL_CAPSULE | ORAL | 0 refills | Status: AC

## 2021-06-09 MED ORDER — LOSARTAN POTASSIUM-HCTZ 50-12.5 MG PO TABS
1.0000 | ORAL_TABLET | Freq: Every day | ORAL | 0 refills | Status: DC
Start: 2021-06-09 — End: 2021-09-01

## 2021-06-09 NOTE — Progress Notes (Signed)
Subjective:    Patient ID: Stephanie Marsh, female    DOB: 08/24/1955, 66 y.o.   MRN: [id number]  HPI  66 year old female returning to [facility] for BP follow up and lab review. See previous notes. She was initially seen in ED with HTN, followed up at [facility] because she has not PCP.  She works two jobs  [employer] in the am and [employer] on second shift daily.  She was started on HCTZ $Remov'25mg'FbEWuG$  at the time she was seen in the ED 05/02/21 She has continued to take this daily   Today's Vitals   06/09/21 1357  BP: (!) 168/88  Pulse: 86  Temp: (!) 97.5 F (36.4 C)  TempSrc: Tympanic  SpO2: 98%  Weight: 154 lb 12.8 oz (70.2 kg)   Body mass index is 32.35 kg/m.   Review of Systems  Constitutional: Negative.   HENT: Negative.    Respiratory: Negative.    Cardiovascular: Negative.   Genitourinary: Negative.   Neurological: Negative.       Objective:   Physical Exam HENT:     Head: Normocephalic.     Nose: Nose normal.  Cardiovascular:     Rate and Rhythm: Normal rate and regular rhythm.     Pulses: Normal pulses.  Pulmonary:     Breath sounds: Normal breath sounds.  Musculoskeletal:     Cervical back: Normal range of motion.  Skin:    General: Skin is warm.  Neurological:     General: No focal deficit present.     Mental Status: She is alert.    Recent Results (from the past 2160 hour(s))  CMP12+LP+TP+TSH+6AC+CBC/D/Plt     Status: Abnormal   Collection Time: 06/01/21  2:31 PM  Result Value Ref Range   Glucose 93 70 - 99 mg/dL   Uric Acid 7.2 3.0 - 7.2 mg/dL    Comment:            Therapeutic target for gout patients: <6.0   BUN 16 8 - 27 mg/dL   Creatinine, Ser 0.67 0.57 - 1.00 mg/dL   eGFR 96 >59 mL/min/1.73   BUN/Creatinine Ratio 24 12 - 28   Sodium 141 134 - 144 mmol/L   Potassium 3.7 3.5 - 5.2 mmol/L   Chloride 102 96 - 106 mmol/L   Calcium 9.1 8.7 - 10.3 mg/dL   Phosphorus 3.3 3.0 - 4.3 mg/dL   Total Protein 7.0 6.0 - 8.5 g/dL    Albumin 4.3 3.8 - 4.8 g/dL   Globulin, Total 2.7 1.5 - 4.5 g/dL   Albumin/Globulin Ratio 1.6 1.2 - 2.2   Bilirubin Total 0.3 0.0 - 1.2 mg/dL   Alkaline Phosphatase 68 44 - 121 IU/L   LDH 212 119 - 226 IU/L   AST 16 0 - 40 IU/L   ALT 15 0 - 32 IU/L   GGT 18 0 - 60 IU/L   Iron 59 27 - 139 ug/dL   Cholesterol, Total 194 100 - 199 mg/dL   Triglycerides 88 0 - 149 mg/dL   HDL 48 >39 mg/dL   VLDL Cholesterol Cal 16 5 - 40 mg/dL   LDL Chol Calc (NIH) 130 (H) 0 - 99 mg/dL   Chol/HDL Ratio 4.0 0.0 - 4.4 ratio    Comment:  T. Chol/HDL Ratio                                             Men  Women                               1/2 Avg.Risk  3.4    3.3                                   Avg.Risk  5.0    4.4                                2X Avg.Risk  9.6    7.1                                3X Avg.Risk 23.4   11.0    Estimated CHD Risk 0.9 0.0 - 1.0 times avg.    Comment: The CHD Risk is based on the T. Chol/HDL ratio. Other factors affect CHD Risk such as hypertension, smoking, diabetes, severe obesity, and family history of premature CHD.    TSH 1.300 0.450 - 4.500 uIU/mL   T4, Total 8.9 4.5 - 12.0 ug/dL   T3 Uptake Ratio 27 24 - 39 %   Free Thyroxine Index 2.4 1.2 - 4.9   WBC 7.2 3.4 - 10.8 x10E3/uL   RBC 4.37 3.77 - 5.28 x10E6/uL   Hemoglobin 12.1 11.1 - 15.9 g/dL   Hematocrit 37.1 34.0 - 46.6 %   MCV 85 79 - 97 fL   MCH 27.7 26.6 - 33.0 pg   MCHC 32.6 31.5 - 35.7 g/dL   RDW 12.4 11.7 - 15.4 %   Platelets 232 150 - 450 x10E3/uL   Neutrophils 62 Not Estab. %   Lymphs 31 Not Estab. %   Monocytes 6 Not Estab. %   Eos 1 Not Estab. %   Basos 0 Not Estab. %   Neutrophils Absolute 4.4 1.4 - 7.0 x10E3/uL   Lymphocytes Absolute 2.2 0.7 - 3.1 x10E3/uL   Monocytes Absolute 0.4 0.1 - 0.9 x10E3/uL   EOS (ABSOLUTE) 0.0 0.0 - 0.4 x10E3/uL   Basophils Absolute 0.0 0.0 - 0.2 x10E3/uL   Immature Granulocytes 0 Not Estab. %   Immature Grans (Abs) 0.0 0.0 - 0.1  x10E3/uL  VITAMIN D 25 Hydroxy (Vit-D Deficiency, Fractures)     Status: Abnormal   Collection Time: 06/01/21  2:31 PM  Result Value Ref Range   Vit D, 25-Hydroxy 9.7 (L) 30.0 - 100.0 ng/mL    Comment: Vitamin D deficiency has been defined by the Institute of Medicine and an Endocrine Society practice guideline as a level of serum 25-OH vitamin D less than 20 ng/mL (1,2). The Endocrine Society went on to further define vitamin D insufficiency as a level between 21 and 29 ng/mL (2). 1. IOM (Institute of Medicine). 2010. Dietary reference    intakes for calcium and D. Gam[place]iel: The    [employer]. 2. Holick MF, Binkley [place], Bischoff-Ferrari HA, et al.    Evaluation, treatment, and prevention of vitamin D    deficiency: an Endocrine Society  clinical practice    guideline. JCEM. 2011 Jul; 96(7):1911-30.          Assessment & Plan:  1. Vitamin D deficiency  - Vitamin D, Ergocalciferol, (DRISDOL) 1.25 MG (50000 UNIT) CAPS capsule; Take 1 capsule (50,000 Units total) by mouth once a week for 12 doses.  Dispense: 12 capsule; Refill: 0  2. Hypertension, unspecified type Stop previous HCTZ and start: - losartan-hydrochlorothiazide (HYZAAR) 50-12.5 MG tablet; Take 1 tablet by mouth daily.  Dispense: 90 tablet; Refill: 0    Scheduled to initiate care with PCP 06/16/21 at [facility]  RTC as needed   Handout on [place] provided

## 2021-06-16 ENCOUNTER — Encounter: Payer: Self-pay | Admitting: Nurse Practitioner

## 2021-06-16 ENCOUNTER — Ambulatory Visit (INDEPENDENT_AMBULATORY_CARE_PROVIDER_SITE_OTHER): Payer: BC Managed Care – PPO | Admitting: Nurse Practitioner

## 2021-06-16 VITALS — BP 150/74 | HR 69 | Ht <= 58 in | Wt 156.9 lb

## 2021-06-16 DIAGNOSIS — Z7689 Persons encountering health services in other specified circumstances: Secondary | ICD-10-CM | POA: Diagnosis not present

## 2021-06-16 DIAGNOSIS — E559 Vitamin D deficiency, unspecified: Secondary | ICD-10-CM | POA: Diagnosis not present

## 2021-06-16 DIAGNOSIS — I10 Essential (primary) hypertension: Secondary | ICD-10-CM | POA: Diagnosis not present

## 2021-06-16 MED ORDER — MELOXICAM 7.5 MG PO TABS: 7.5000 mg | ORAL_TABLET | Freq: Every day | ORAL | 0 refills | Status: DC

## 2021-06-16 NOTE — Progress Notes (Signed)
New Patient Office Visit  Subjective    Patient ID: Stephanie Marsh, female    DOB: 06-09-1955  Age: 66 y.o. MRN: [id number]  CC:  Chief Complaint  Patient presents with   New Patient (Initial Visit)    HPI Stephanie Marsh presents to establish care. She works at  [employer] and [employer] per time. She does not had a PCP previously. She has newly diagnosed hypertension. She has a h/o back pain, vit d deficiency and allergies.    Outpatient Encounter Medications as of 06/16/2021  Medication Sig   loratadine (CLARITIN) 10 MG tablet Take 1 tablet (10 mg total) by mouth daily.   losartan-hydrochlorothiazide (HYZAAR) 50-12.5 MG tablet Take 1 tablet by mouth daily.   meloxicam (MOBIC) 7.5 MG tablet Take 1 tablet (7.5 mg total) by mouth daily.   mometasone (ELOCON) 0.1 % cream Apply to aa's face BID x 2 weeks. Then decrease to QD 5 days per week.   Vitamin D, Ergocalciferol, (DRISDOL) 1.25 MG (50000 UNIT) CAPS capsule Take 1 capsule (50,000 Units total) by mouth once a week for 12 doses.   [DISCONTINUED] lidocaine (LIDODERM) 5 % Place 1 patch onto the skin every 12 (twelve) hours. Remove & Discard patch within 12 hours or as directed by MD (Patient not taking: Reported on 06/16/2021)   No facility-administered encounter medications on file as of 06/16/2021.    Past Medical History:  Diagnosis Date   Hypertension     Past Surgical History:  Procedure Laterality Date   NO PAST SURGERIES      Family History  Problem Relation Age of Onset   Diabetes Mother    Hypertension Father     Social History   Socioeconomic History   Marital status: Married    Spouse name: Not on file   Number of children: Not on file   Years of education: Not on file   Highest education level: Not on file  Occupational History   Not on file  Tobacco Use   Smoking status: Former    Types: Cigarettes    Quit date: 28    Years since quitting: 29.4   Smokeless tobacco: Never  Vaping Use   Vaping  Use: Never used  Substance and Sexual Activity   Alcohol use: Never   Drug use: Never   Sexual activity: Yes  Other Topics Concern   Not on file  Social History Narrative   Not on file   Social Determinants of Health   Financial Resource Strain: Not on file  Food Insecurity: Not on file  Transportation Needs: Not on file  Physical Activity: Not on file  Stress: Not on file  Social Connections: Not on file  Intimate Partner Violence: Not on file    Review of Systems  Constitutional:  Negative for chills and fever.  HENT:  Negative for hearing loss, sinus pain and tinnitus.   Eyes:  Negative for double vision and discharge.  Respiratory:  Negative for cough and shortness of breath.   Cardiovascular:  Negative for chest pain and leg swelling.  Gastrointestinal:  Negative for constipation and heartburn.  Genitourinary:  Negative for dysuria and hematuria.  Musculoskeletal:  Positive for back pain. Negative for myalgias.  Neurological:  Negative for dizziness, sensory change and weakness.  Psychiatric/Behavioral:  Negative for depression, substance abuse and suicidal ideas.        Objective    BP (!) 150/74   Pulse 69   Ht 4\' 10"  (1.473  m)   Wt 156 lb 14.4 oz (71.2 kg)   BMI 32.79 kg/m   Physical Exam Constitutional:      Appearance: Normal appearance. She is obese.  HENT:     Head: Normocephalic and atraumatic.     Right Ear: Tympanic membrane normal.     Left Ear: Tympanic membrane normal.     Nose: Nose normal.     Mouth/Throat:     Mouth: Mucous membranes are moist.     Pharynx: Oropharynx is clear.  Eyes:     Extraocular Movements: Extraocular movements intact.     Conjunctiva/sclera: Conjunctivae normal.     Pupils: Pupils are equal, round, and reactive to light.  Cardiovascular:     Rate and Rhythm: Normal rate and regular rhythm.     Pulses: Normal pulses.     Heart sounds: Normal heart sounds.  Pulmonary:     Effort: Pulmonary effort is normal.      Breath sounds: Normal breath sounds.  Abdominal:     General: Bowel sounds are normal.     Palpations: Abdomen is soft.  Musculoskeletal:        General: Normal range of motion.     Cervical back: Normal range of motion.  Skin:    General: Skin is warm.  Neurological:     General: No focal deficit present.     Mental Status: She is alert and oriented to person, place, and time. Mental status is at baseline.  Psychiatric:        Mood and Affect: Mood normal.        Behavior: Behavior normal.        Thought Content: Thought content normal.        Judgment: Judgment normal.        Assessment & Plan:   Problem List Items Addressed This Visit       Cardiovascular and Mediastinum   Hypertension    Pt blood pressure not controlled. Discussed the pt medication regimen and verified that they are taking the medication appropriately.  Advise pt to limit the intake of sodium. Advise pt to increase the intake of water, and perform regular exercise   Advise pt to measure the BP at home and bring it to the next appointment.  Would consider to increase the medication dosage if the BP not controlled.           Other   Establishing care with new doctor, encounter for - Primary    Care established. Advised patient to eat balanced diet and perform 150 minutes of physical activity per week.        Vitamin D deficiency    Continue the vit D supplement evert week. Consume good fat like nuts and avocado with Vit D for better absorption.        Return in about 1 month (around 07/16/2021).   Theresia Lo, NP

## 2021-06-21 ENCOUNTER — Other Ambulatory Visit: Payer: Self-pay | Admitting: Medical

## 2021-06-21 DIAGNOSIS — E559 Vitamin D deficiency, unspecified: Secondary | ICD-10-CM | POA: Insufficient documentation

## 2021-06-21 DIAGNOSIS — Z7689 Persons encountering health services in other specified circumstances: Secondary | ICD-10-CM | POA: Insufficient documentation

## 2021-06-21 DIAGNOSIS — I10 Essential (primary) hypertension: Secondary | ICD-10-CM | POA: Insufficient documentation

## 2021-06-21 NOTE — Assessment & Plan Note (Signed)
Pt blood pressure not controlled. Discussed the pt medication regimen and verified that they are taking the medication appropriately.  Advise pt to limit the intake of sodium. Advise pt to increase the intake of water, and perform regular exercise   Advise pt to measure the BP at home and bring it to the next appointment.  Would consider to increase the medication dosage if the BP not controlled.

## 2021-06-21 NOTE — Assessment & Plan Note (Signed)
Continue the vit D supplement evert week. Consume good fat like nuts and avocado with Vit D for better absorption.

## 2021-06-21 NOTE — Assessment & Plan Note (Signed)
Care established. Advised patient to eat balanced diet and perform 150 minutes of physical activity per week.

## 2021-07-14 ENCOUNTER — Other Ambulatory Visit: Payer: Self-pay | Admitting: Nurse Practitioner

## 2021-08-04 ENCOUNTER — Other Ambulatory Visit: Payer: Self-pay | Admitting: Nurse Practitioner

## 2021-08-04 MED ORDER — IBUPROFEN 800 MG PO TABS: 800.0000 mg | ORAL_TABLET | Freq: Three times a day (TID) | ORAL | 0 refills | Status: DC | PRN

## 2021-08-28 ENCOUNTER — Encounter: Payer: Self-pay | Admitting: Emergency Medicine

## 2021-08-28 ENCOUNTER — Other Ambulatory Visit: Payer: Self-pay

## 2021-08-28 ENCOUNTER — Emergency Department
Admission: EM | Admit: 2021-08-28 | Discharge: 2021-08-28 | Disposition: A | Discharge status: Home / Self Care | Payer: BC Managed Care – PPO | Attending: Emergency Medicine | Admitting: Emergency Medicine

## 2021-08-28 DIAGNOSIS — H6991 Unspecified Eustachian tube disorder, right ear: Secondary | ICD-10-CM | POA: Insufficient documentation

## 2021-08-28 DIAGNOSIS — M25512 Pain in left shoulder: Secondary | ICD-10-CM | POA: Insufficient documentation

## 2021-08-28 DIAGNOSIS — M545 Low back pain, unspecified: Secondary | ICD-10-CM | POA: Diagnosis not present

## 2021-08-28 DIAGNOSIS — M7918 Myalgia, other site: Secondary | ICD-10-CM

## 2021-08-28 DIAGNOSIS — H6981 Other specified disorders of Eustachian tube, right ear: Secondary | ICD-10-CM | POA: Diagnosis not present

## 2021-08-28 DIAGNOSIS — H9201 Otalgia, right ear: Secondary | ICD-10-CM | POA: Diagnosis not present

## 2021-08-28 DIAGNOSIS — M25511 Pain in right shoulder: Secondary | ICD-10-CM | POA: Insufficient documentation

## 2021-08-28 MED ORDER — PREDNISONE 10 MG PO TABS: ORAL_TABLET | ORAL | 0 refills | Status: DC

## 2021-08-28 MED ORDER — FLUTICASONE PROPIONATE 50 MCG/ACT NA SUSP: 2.0000 | Freq: Every day | NASAL | 0 refills | Status: DC

## 2021-08-28 NOTE — Discharge Instructions (Addendum)
Follow-up with your primary care provider if any continued problems or concerns.  Discontinue taking the ibuprofen as it does not seem to be helping with your symptoms.  The Flonase nasal spray is 2 sprays to each nostril once daily and the prednisone tablets are a tapering dose over the next 6 days.  If you are not improving with your ear pain you should follow-up with [facility] and the doctor that is on-call along with contact information is listed on your discharge papers.  For your intermittent musculoskeletal pain you will need to follow-up with your primary care provider if you have continued problems.

## 2021-08-28 NOTE — ED Provider Notes (Signed)
[facility] Provider Note    Event Date/Time   First MD Initiated Contact with Patient 08/28/21 1449     (approximate)   History   Otalgia and Back Pain   HPI  Stephanie Marsh is a 66 y.o. female   comes to the ED with complaint of right ear pain off and on for 1 month.  Patient denies any upper respiratory infection, fever, chills or nasal congestion.  Patient has used earwax remover and also over-the-counter eardrops as she was advised by her PCP.  Also patient has been taking ibuprofen for her back without any relief.  Patient denies any urinary symptoms or history of kidney stone.  No history of injury to her back.  There is no specific place that bothers her more than the other it is a generalized back pain from her shoulders down to the sacral area that is intermittent and without radiation.  He also saw her PCP for this as well.      Physical Exam   Triage Vital Signs: ED Triage Vitals  Enc Vitals Group     BP 08/28/21 1425 (!) 164/77     Pulse Rate 08/28/21 1425 82     Resp 08/28/21 1425 20     Temp 08/28/21 1425 98.4 F (36.9 C)     Temp Source 08/28/21 1425 Oral     SpO2 08/28/21 1425 97 %     Weight 08/28/21 1417 158 lb 11.7 oz (72 kg)     Height 08/28/21 1417 4\' 10"  (1.473 m)     Head Circumference --      Peak Flow --      Pain Score 08/28/21 1417 2     Pain Loc --      Pain Edu? --      Excl. in GC? --     Most recent vital signs: Vitals:   08/28/21 1425  BP: (!) 164/77  Pulse: 82  Resp: 20  Temp: 98.4 F (36.9 C)  SpO2: 97%     General: Awake, no distress.  CV:  Good peripheral perfusion.  Resp:  Normal effort.  Lungs are clear bilaterally. Abd:  No distention.  Other:  Left EAC and TM are clear.  Right EAC is clear and TM is dull with poor light reflex.  On examination of the back there is no point tenderness but generalized tenderness in the muscle area bilaterally from the shoulders down to the lower back.  Range of  motion is without restriction.  No point tenderness on palpation of the thoracic or lumbar spine.  Patient is able to stand and ambulate without any assistance.   ED Results / Procedures / Treatments   Labs (all labs ordered are listed, but only abnormal results are displayed) Labs Reviewed - No data to display     PROCEDURES:  Critical Care performed:   Procedures   MEDICATIONS ORDERED IN ED: Medications - No data to display   IMPRESSION / MDM / ASSESSMENT AND PLAN / ED COURSE  I reviewed the triage vital signs and the nursing notes.   Differential diagnosis includes, but is not limited to, otitis media, otitis externa, serous otitis, eustachian tube dysfunction, cerumen impaction, generalized musculoskeletal pain.  66 year old female presents to the ED with complaint of intermittent right ear pain without improvement after using earwax removal and over-the-counter eardrops as advised by her PCP.  Exam shows that she has a eustachian tube dysfunction and that her hearing is  somewhat muffled.  No cerumen was in the canal.  On examination of her back there is no specific place but more of a generalized discomfort that does not restrict her movement.  No point tenderness on palpation of the thoracic or lumbar spine.  Normal gait was noted.  Patient was advised to discontinue taking the ibuprofen from her PCP and a prescription for Flonase nasal spray and prednisone was sent to the pharmacy which not only should help with her eustachian tube dysfunction but also with her generalized back pain.  She will follow-up with her PCP and also [facility] if any continued problems with her ear.      Patient's presentation is most consistent with acute, uncomplicated illness.  FINAL CLINICAL IMPRESSION(S) / ED DIAGNOSES   Final diagnoses:  Eustachian tube dysfunction, right  Musculoskeletal pain     Rx / DC Orders   ED Discharge Orders          Ordered    fluticasone (FLONASE) 50  MCG/ACT nasal spray  Daily        08/28/21 1512    predniSONE (DELTASONE) 10 MG tablet        08/28/21 1512             Note:  This document was prepared using Dragon voice recognition software and may include unintentional dictation errors.   Tommi Rumps, PA-C 08/28/21 1527    Chesley Noon, MD 08/29/21 1836

## 2021-08-28 NOTE — ED Triage Notes (Signed)
Pt reports pain to right ear for the past month. Pt states has used ear wash with no relief. Pt also reports pain to her lower back, denies heavy lifting but states she does do some twisting and turning

## 2021-09-01 ENCOUNTER — Other Ambulatory Visit: Payer: Self-pay | Admitting: *Deleted

## 2021-09-01 DIAGNOSIS — I10 Essential (primary) hypertension: Secondary | ICD-10-CM

## 2021-09-01 MED ORDER — LOSARTAN POTASSIUM-HCTZ 50-12.5 MG PO TABS: 1.0000 | ORAL_TABLET | Freq: Every day | ORAL | 1 refills | Status: DC

## 2021-09-02 ENCOUNTER — Telehealth: Payer: Self-pay | Admitting: Pharmacist

## 2021-09-02 NOTE — Telephone Encounter (Signed)
   09/02/2021 Name: ONIA SHIFLETT MRN: [id number] DOB: 1955-05-24  Patient was referred to the pharmacist by their PCP for assistance in managing hypertension  Patient appearing on report for [facility] - Hypertension Control report due to last documented ambulatory blood pressure of 150/74 on 06/16/2021. No next appointment with PCP is currently scheduled   Outreached patient to discuss hypertension control and medication management. Was unable to reach patient via telephone today and unable to leave a message as patient's voicemail is full.   Follow Up Plan: CM Pharmacist will outreach to patient by telephone again within the next 30 days  Estelle Grumbles, PharmD, [facility] [phone]

## 2021-09-07 NOTE — Progress Notes (Unsigned)
Established Patient Office Visit  Subjective    Patient ID: HENRITTA MUTZ, female    DOB: January 22, 1955  Age: 66 y.o. MRN: [id number]  CC:  No chief complaint on file.   HPI RIKA DAUGHDRILL presents for follow-up on hypertension.  She has a h/o back pain, vit d deficiency and allergies.    Outpatient Encounter Medications as of 09/08/2021  Medication Sig   fluticasone (FLONASE) 50 MCG/ACT nasal spray Place 2 sprays into both nostrils daily.   ibuprofen (ADVIL) 800 MG tablet Take 1 tablet (800 mg total) by mouth every 8 (eight) hours as needed.   loratadine (CLARITIN) 10 MG tablet Take 1 tablet (10 mg total) by mouth daily.   losartan-hydrochlorothiazide (HYZAAR) 50-12.5 MG tablet Take 1 tablet by mouth daily.   mometasone (ELOCON) 0.1 % cream Apply to aa's face BID x 2 weeks. Then decrease to QD 5 days per week.   predniSONE (DELTASONE) 10 MG tablet Take 6 tablets  today, on day 2 take 5 tablets, day 3 take 4 tablets, day 4 take 3 tablets, day 5 take  2 tablets and 1 tablet the last day   No facility-administered encounter medications on file as of 09/08/2021.    Past Medical History:  Diagnosis Date   Hypertension     Past Surgical History:  Procedure Laterality Date   NO PAST SURGERIES      Family History  Problem Relation Age of Onset   Diabetes Mother    Hypertension Father     Social History   Socioeconomic History   Marital status: Married    Spouse name: Not on file   Number of children: Not on file   Years of education: Not on file   Highest education level: Not on file  Occupational History   Not on file  Tobacco Use   Smoking status: Former    Types: Cigarettes    Quit date: 31    Years since quitting: 29.6   Smokeless tobacco: Never  Vaping Use   Vaping Use: Never used  Substance and Sexual Activity   Alcohol use: Never   Drug use: Never   Sexual activity: Yes  Other Topics Concern   Not on file   Social History Narrative   Not on file   Social Determinants of Health   Financial Resource Strain: Not on file  Food Insecurity: Not on file  Transportation Needs: Not on file  Physical Activity: Not on file  Stress: Not on file  Social Connections: Not on file  Intimate Partner Violence: Not on file    Review of Systems  Constitutional:  Negative for chills and fever.  HENT:  Negative for hearing loss, sinus pain and tinnitus.   Eyes:  Negative for double vision and discharge.  Respiratory:  Negative for cough and shortness of breath.   Cardiovascular:  Negative for chest pain and leg swelling.  Gastrointestinal:  Negative for constipation and heartburn.  Genitourinary:  Negative for dysuria and hematuria.  Musculoskeletal:  Positive for back pain. Negative for myalgias.  Neurological:  Negative for dizziness, sensory change and weakness.  Psychiatric/Behavioral:  Negative for depression, substance abuse and suicidal ideas.  Objective    There were no vitals taken for this visit.  Physical Exam Constitutional:      Appearance: Normal appearance. She is obese.  HENT:     Head: Normocephalic and atraumatic.     Right Ear: Tympanic membrane normal.     Left Ear: Tympanic membrane normal.     Nose: Nose normal.     Mouth/Throat:     Mouth: Mucous membranes are moist.     Pharynx: Oropharynx is clear.  Eyes:     Extraocular Movements: Extraocular movements intact.     Conjunctiva/sclera: Conjunctivae normal.     Pupils: Pupils are equal, round, and reactive to light.  Cardiovascular:     Rate and Rhythm: Normal rate and regular rhythm.     Pulses: Normal pulses.     Heart sounds: Normal heart sounds.  Pulmonary:     Effort: Pulmonary effort is normal.     Breath sounds: Normal breath sounds.  Abdominal:     General: Bowel sounds are normal.     Palpations: Abdomen is soft.  Musculoskeletal:        General: Normal range of motion.     Cervical back:  Normal range of motion.  Skin:    General: Skin is warm.  Neurological:     General: No focal deficit present.     Mental Status: She is alert and oriented to person, place, and time. Mental status is at baseline.  Psychiatric:        Mood and Affect: Mood normal.        Behavior: Behavior normal.        Thought Content: Thought content normal.        Judgment: Judgment normal.         Assessment & Plan:   Problem List Items Addressed This Visit       Cardiovascular and Mediastinum   Hypertension - Primary     Other   Vitamin D deficiency   No follow-ups on file.   Kara Dies, NP

## 2021-09-08 ENCOUNTER — Ambulatory Visit (INDEPENDENT_AMBULATORY_CARE_PROVIDER_SITE_OTHER): Payer: BC Managed Care – PPO | Admitting: Nurse Practitioner

## 2021-09-08 ENCOUNTER — Encounter: Payer: Self-pay | Admitting: Nurse Practitioner

## 2021-09-08 VITALS — BP 145/84 | HR 71 | Ht <= 58 in | Wt 157.5 lb

## 2021-09-08 DIAGNOSIS — G8929 Other chronic pain: Secondary | ICD-10-CM

## 2021-09-08 DIAGNOSIS — E559 Vitamin D deficiency, unspecified: Secondary | ICD-10-CM

## 2021-09-08 DIAGNOSIS — E669 Obesity, unspecified: Secondary | ICD-10-CM

## 2021-09-08 DIAGNOSIS — I10 Essential (primary) hypertension: Secondary | ICD-10-CM | POA: Diagnosis not present

## 2021-09-08 DIAGNOSIS — E66811 Obesity, class 1: Secondary | ICD-10-CM

## 2021-09-08 DIAGNOSIS — M545 Low back pain, unspecified: Secondary | ICD-10-CM | POA: Diagnosis not present

## 2021-09-08 DIAGNOSIS — Z6832 Body mass index (BMI) 32.0-32.9, adult: Secondary | ICD-10-CM

## 2021-09-08 NOTE — Assessment & Plan Note (Signed)
Body mass index is 32.92 kg/m. Advised pt to lose weight. Advised patient to avoid trans fat, fatty and fried food. Follow a regular physical activity schedule. Went over the risk of chronic diseases with increased weight.

## 2021-09-08 NOTE — Assessment & Plan Note (Signed)
Advised patient to use OTC Voltaren gel. Encouraged to do stretching exercises and alternate hot and cold pack. We will continue to monitor.

## 2021-09-08 NOTE — Assessment & Plan Note (Signed)
Blood pressure 145/84 in the office today. Advised patient to consume low-salt heart healthy diet. Reviewed the food labels with patient to check the content of sodium in the food. Continue losartan/HCTZ  50-12.5 mg daily. We will continue to monitor.

## 2021-09-20 ENCOUNTER — Emergency Department
Admission: EM | Admit: 2021-09-20 | Discharge: 2021-09-20 | Disposition: A | Discharge status: Home / Self Care | Payer: BC Managed Care – PPO | Attending: Emergency Medicine | Admitting: Emergency Medicine

## 2021-09-20 ENCOUNTER — Other Ambulatory Visit: Payer: Self-pay

## 2021-09-20 DIAGNOSIS — M545 Low back pain, unspecified: Secondary | ICD-10-CM | POA: Diagnosis not present

## 2021-09-20 DIAGNOSIS — M5431 Sciatica, right side: Secondary | ICD-10-CM | POA: Diagnosis not present

## 2021-09-20 DIAGNOSIS — M5441 Lumbago with sciatica, right side: Secondary | ICD-10-CM | POA: Diagnosis not present

## 2021-09-20 DIAGNOSIS — G8929 Other chronic pain: Secondary | ICD-10-CM

## 2021-09-20 MED ORDER — METHOCARBAMOL 500 MG PO TABS: 500.0000 mg | ORAL_TABLET | Freq: Four times a day (QID) | ORAL | 0 refills | Status: DC

## 2021-09-20 MED ORDER — METHOCARBAMOL 500 MG PO TABS
500.0000 mg | ORAL_TABLET | Freq: Once | ORAL | Status: DC
  Filled 2021-09-20: qty 1

## 2021-09-20 MED ORDER — LIDOCAINE 5 % EX PTCH
1.0000 | MEDICATED_PATCH | CUTANEOUS | Status: DC
Start: 2021-09-20 — End: 2021-09-20
  Administered 2021-09-20: 1 via TRANSDERMAL
  Filled 2021-09-20: qty 1

## 2021-09-20 MED ORDER — LIDOCAINE 5 % EX PTCH: 1.0000 | MEDICATED_PATCH | Freq: Two times a day (BID) | CUTANEOUS | 0 refills | Status: DC

## 2021-09-20 MED ORDER — ACETAMINOPHEN 500 MG PO TABS
1000.0000 mg | ORAL_TABLET | Freq: Once | ORAL | Status: AC
  Administered 2021-09-20: 1000 mg via ORAL
  Filled 2021-09-20: qty 2

## 2021-09-20 MED ORDER — KETOROLAC TROMETHAMINE 30 MG/ML IJ SOLN
30.0000 mg | Freq: Once | INTRAMUSCULAR | Status: AC
  Administered 2021-09-20: 30 mg via INTRAMUSCULAR
  Filled 2021-09-20: qty 1

## 2021-09-20 NOTE — ED Provider Notes (Signed)
[facility] Provider Note    Event Date/Time   First MD Initiated Contact with Patient 09/20/21 [phone]     (approximate)   History   Back Pain   HPI  Stephanie Marsh is a 66 y.o. female who presents to the ED for evaluation of Back Pain   Patient presents to the ED evaluation of 1 month of atraumatic right-sided lower back pain.  She reports the pain radiates superiorly to the right side along her spine upper thoracic back, as well as down her right hip and buttocks.  Reports taking Tylenol with some transient improvement, but it always comes back.  Denies any falls or injuries, fever, saddle anesthesias, urinary or stool retention.  Reports concern that the pain is not gone away altogether.  She has not tried any rehab exercises or PT.   Physical Exam   Triage Vital Signs: ED Triage Vitals  Enc Vitals Group     BP 09/20/21 0009 (!) 160/73     Pulse Rate 09/20/21 0009 76     Resp 09/20/21 0009 16     Temp 09/20/21 0009 97.8 F (36.6 C)     Temp Source 09/20/21 0009 Oral     SpO2 09/20/21 0009 94 %     Weight --      Height --      Head Circumference --      Peak Flow --      Pain Score 09/20/21 0019 5     Pain Loc --      Pain Edu? --      Excl. in GC? --     Most recent vital signs: Vitals:   09/20/21 0009 09/20/21 0151  BP: (!) 160/73 (!) 158/76  Pulse: 76 78  Resp: 16 16  Temp: 97.8 F (36.6 C) 98.1 F (36.7 C)  SpO2: 94% 94%    General: Awake, no distress.  Looks systemically well.  Ambulatory independently with a normal gait. CV:  Good peripheral perfusion.  Resp:  Normal effort.  Abd:  No distention.  MSK:  No deformity noted.  Mild right-sided paraspinal lumbar tenderness to palpation that reproduces her symptoms without overlying skin changes or signs of trauma. Neuro:  No focal deficits appreciated.  Cranial nerves II through XII intact 5/5 strength and sensation in all 4 extremities Other:     ED Results / Procedures /  Treatments   Labs (all labs ordered are listed, but only abnormal results are displayed) Labs Reviewed - No data to display   EKG   RADIOLOGY   Official radiology report(s): No results found.  PROCEDURES and INTERVENTIONS:  Procedures  Medications  lidocaine (LIDODERM) 5 % 1 patch (1 patch Transdermal Patch Applied 09/20/21 0044)  methocarbamol (ROBAXIN) tablet 500 mg (500 mg Oral Not Given 09/20/21 0104)  acetaminophen (TYLENOL) tablet 1,000 mg (1,000 mg Oral Given 09/20/21 0045)  ketorolac (TORADOL) 30 MG/ML injection 30 mg (30 mg Intramuscular Given 09/20/21 0048)     IMPRESSION / MDM / ASSESSMENT AND PLAN / ED COURSE  I reviewed the triage vital signs and the nursing notes.  Differential diagnosis includes, but is not limited to, lumbar strain, sciatica, cauda equina, lumbar fracture  {Patient presents with symptoms of an acute illness or injury that is potentially life-threatening.  66 year old woman presents to the ED with continued chronic atraumatic lumbar pain that I suspect is MSK in etiology.  I see no indications for diagnostics at this point.  No no  indications for MRI or lumbar imaging.  No indications for serum work-up.  We will provide nonnarcotic multimodal analgesia and reassess with anticipation of outpatient management.  Clinical Course as of 09/20/21 0204  Tue Sep 20, 2021  0142 Reassessed.  Feeling much better.  We discussed nonnarcotic multimodal analgesia at home as well as return precautions for the ED.  Answered questions. [DS]    Clinical Course User Index [DS] Delton Prairie, MD     FINAL CLINICAL IMPRESSION(S) / ED DIAGNOSES   Final diagnoses:  Sciatica of right side  Chronic right-sided low back pain with right-sided sciatica     Rx / DC Orders   ED Discharge Orders          Ordered    lidocaine (LIDODERM) 5 %  Every 12 hours,   Status:  Discontinued        09/20/21 0052    lidocaine (LIDODERM) 5 %  Every 12 hours        09/20/21 0055     methocarbamol (ROBAXIN) 500 MG tablet  4 times daily        09/20/21 0055             Note:  This document was prepared using Dragon voice recognition software and may include unintentional dictation errors.   Delton Prairie, MD 09/20/21 [phone]

## 2021-09-20 NOTE — Discharge Instructions (Addendum)
Use Tylenol for pain and fevers.  Up to 1000 mg per dose, up to 4 times per day.  Do not take more than 4000 mg of Tylenol/acetaminophen within 24 hours..  Use naproxen/Aleve for anti-inflammatory pain relief. Use up to 500mg  every 12 hours. Do not take more frequently than this. Do not use other NSAIDs (ibuprofen, Advil) while taking this medication. It is safe to take Tylenol with this.   Please use lidocaine patches at your site of pain.  Apply 1 patch at a time, leave on for 12 hours, then remove for 12 hours.  12 hours on, 12 hours off.  Do not apply more than 1 patch at a time.  Use methocarbamol/Robaxin muscle relaxer as needed for more severe/breakthrough pain.  Do not drive or operate machinery with this medication as it may make you a little bit sleepy.

## 2021-09-20 NOTE — ED Notes (Signed)
Patient reports she is driving and can not take medication at this time

## 2021-09-20 NOTE — ED Triage Notes (Signed)
Pt presents to ER with c/o right lower back pain x1 month.  Pt states pain radiates from right flank into right shoulder blade area and states pain is worse when moving.  Pt denies any urinary problems.  Pt does endorse being on her feet a lot at work.  Pt is A&O x4 at this time in NAD in triage.

## 2021-09-24 ENCOUNTER — Other Ambulatory Visit: Payer: Self-pay | Admitting: *Deleted

## 2021-09-24 DIAGNOSIS — Z1231 Encounter for screening mammogram for malignant neoplasm of breast: Secondary | ICD-10-CM

## 2021-09-26 ENCOUNTER — Telehealth: Payer: Self-pay | Admitting: Pharmacist

## 2021-09-26 NOTE — Chronic Care Management (AMB) (Signed)
    09/26/2021 Name: Stephanie Marsh MRN: [id number] DOB: 24-Feb-1955   Patient was referred to the pharmacist by their PCP for assistance in managing hypertension   Patient appearing on report for [facility] - Hypertension Control report due to last documented ambulatory blood pressure of 145/84 on 09/08/2021. No next appointment with PCP is currently scheduled    Outreached patient to discuss hypertension control and medication management. Was unable to reach patient via telephone today and unable to leave a message as patient's voicemail is full. Outreach attempt #2.  Estelle Grumbles, PharmD, [facility] [phone]

## 2021-10-28 ENCOUNTER — Ambulatory Visit
Admission: EM | Admit: 2021-10-28 | Discharge: 2021-10-28 | Disposition: A | Discharge status: Home / Self Care | Payer: BC Managed Care – PPO | Attending: Urgent Care | Admitting: Urgent Care

## 2021-10-28 DIAGNOSIS — G8929 Other chronic pain: Secondary | ICD-10-CM | POA: Diagnosis not present

## 2021-10-28 DIAGNOSIS — H6991 Unspecified Eustachian tube disorder, right ear: Secondary | ICD-10-CM | POA: Diagnosis not present

## 2021-10-28 DIAGNOSIS — M549 Dorsalgia, unspecified: Secondary | ICD-10-CM | POA: Diagnosis not present

## 2021-10-28 MED ORDER — PREDNISONE 50 MG PO TABS: 50.0000 mg | ORAL_TABLET | Freq: Every day | ORAL | 0 refills | Status: AC

## 2021-10-28 NOTE — Discharge Instructions (Addendum)
Use fluticasone (Flonase) nasal spray every day.  This will help to relieve the swelling that is causing your ear to feel clogged.     Take prednisone in the morning with your breakfast and other medications.  Do not take ibuprofen, Advil, Aleve while taking this medication.  Since your symptoms seem to be chronic, I recommend you request a referral to an ENT specialist for your ear and an orthopedic specialist for your back pain.

## 2021-10-28 NOTE — ED Provider Notes (Addendum)
Roderic Palau    CSN: [id number] Arrival date & time: 10/28/21  1414      History   Chief Complaint Chief Complaint  Patient presents with   Back Pain   Shoulder Pain   Otalgia    HPI Stephanie Marsh is a 66 y.o. female.    Back Pain Shoulder Pain Associated symptoms: back pain   Otalgia   Presents to urgent care with complaint of chronic back pain.  Pain is right-sided mid thoracic and lower back.  She was treated in the ED about 2 months ago for same problem and given Robaxin and a course of steroids.  She states her symptoms improved with the steroids but then returned.  She states her symptoms are improved with Robaxin but she cannot take regularly because she works 2 jobs and it makes her sleepy.  Also complains of right ear dullness of hearing.  This is also a chronic issue.  Also seen in the ED and diagnosed with eustachian tube disorder recommended daily Flonase.  She says she does not take this daily.  Past Medical History:  Diagnosis Date   Hypertension     Patient Active Problem List   Diagnosis Date Noted   Chronic bilateral low back pain without sciatica 09/08/2021   Class 1 obesity without serious comorbidity with body mass index (BMI) of 32.0 to 32.9 in adult 09/08/2021   Establishing care with new doctor, encounter for 06/21/2021   Hypertension 06/21/2021   Vitamin D deficiency 06/21/2021    Past Surgical History:  Procedure Laterality Date   NO PAST SURGERIES      OB History   No obstetric history on file.      Home Medications    Prior to Admission medications   Medication Sig Start Date End Date Taking? Authorizing Provider  fluticasone (FLONASE) 50 MCG/ACT nasal spray Place 2 sprays into both nostrils daily. 08/28/21 08/28/22  Johnn Hai, PA-C  ibuprofen (ADVIL) 800 MG tablet Take 1 tablet (800 mg total) by mouth every 8 (eight) hours as needed. 08/04/21   Theresia Lo, NP  lidocaine (LIDODERM) 5 % Place 1 patch onto  the skin every 12 (twelve) hours. Remove & Discard patch within 12 hours or as directed by MD 09/20/21 09/20/22  Vladimir Crofts, MD  loratadine (CLARITIN) 10 MG tablet Take 1 tablet (10 mg total) by mouth daily. 06/09/21 06/04/22  Apolonio Schneiders, FNP  losartan-hydrochlorothiazide (HYZAAR) 50-12.5 MG tablet Take 1 tablet by mouth daily. 09/01/21 11/30/21  Cletis Athens, MD  methocarbamol (ROBAXIN) 500 MG tablet Take 1 tablet (500 mg total) by mouth 4 (four) times daily. 09/20/21   Vladimir Crofts, MD  mometasone (ELOCON) 0.1 % cream Apply to aa's face BID x 2 weeks. Then decrease to QD 5 days per week. 07/30/19   Ralene Bathe, MD  predniSONE (DELTASONE) 10 MG tablet Take 6 tablets  today, on day 2 take 5 tablets, day 3 take 4 tablets, day 4 take 3 tablets, day 5 take  2 tablets and 1 tablet the last day 08/28/21   Johnn Hai, PA-C    Family History Family History  Problem Relation Age of Onset   Diabetes Mother    Hypertension Father     Social History Social History   Tobacco Use   Smoking status: Former    Types: Cigarettes    Quit date: 1994    Years since quitting: 29.8   Smokeless tobacco: Never  Vaping Use  Vaping Use: Never used  Substance Use Topics   Alcohol use: Never   Drug use: Never     Allergies   Patient has no known allergies.   Review of Systems Review of Systems  HENT:  Positive for ear pain.   Musculoskeletal:  Positive for back pain.     Physical Exam Triage Vital Signs ED Triage Vitals  Enc Vitals Group     BP 10/28/21 1442 (!) 151/85     Pulse Rate 10/28/21 1442 67     Resp 10/28/21 1442 16     Temp 10/28/21 1442 98.2 F (36.8 C)     Temp src --      SpO2 10/28/21 1442 97 %     Weight --      Height --      Head Circumference --      Peak Flow --      Pain Score 10/28/21 1443 4     Pain Loc --      Pain Edu? --      Excl. in GC? --    No data found.  Updated Vital Signs BP (!) 151/85   Pulse 67   Temp 98.2 F (36.8 C)   Resp 16    SpO2 97%   Visual Acuity Right Eye Distance:   Left Eye Distance:   Bilateral Distance:    Right Eye Near:   Left Eye Near:    Bilateral Near:     Physical Exam Vitals reviewed.  Constitutional:      Appearance: Normal appearance.  HENT:     Right Ear: Ear canal normal. There is no impacted cerumen.     Left Ear: Ear canal normal. There is no impacted cerumen.  Musculoskeletal:       Back:  Skin:    General: Skin is warm and dry.  Neurological:     General: No focal deficit present.     Mental Status: She is alert and oriented to person, place, and time.  Psychiatric:        Mood and Affect: Mood normal.        Behavior: Behavior normal.      UC Treatments / Results  Labs (all labs ordered are listed, but only abnormal results are displayed) Labs Reviewed - No data to display  EKG   Radiology No results found.  Procedures Procedures (including critical care time)  Medications Ordered in UC Medications - No data to display  Initial Impression / Assessment and Plan / UC Course  I have reviewed the triage vital signs and the nursing notes.  Pertinent labs & imaging results that were available during my care of the patient were reviewed by me and considered in my medical decision making (see chart for details).   TMs have dull light reflex bilaterally.  EACs are unoccluded.  Asked patient to take Flonase daily and request referral to ENT.  Possible need for audiology.  Will provide course of prednisone again for her, but recommended referral to orthopedics for evaluation.   Final Clinical Impressions(s) / UC Diagnoses   Final diagnoses:  Other chronic back pain     Discharge Instructions      Use fluticasone (Flonase) nasal spray every day.  This will help to relieve the swelling that is causing your ear to feel clogged.  Take prednisone in the morning with your breakfast and other medications.  Do not take ibuprofen, Advil, Aleve while taking this  medication.  Since your  symptoms seem to be chronic, I recommend you request a referral to an ENT specialist for your ear and an orthopedic specialist for your back pain.    ED Prescriptions   None    PDMP not reviewed this encounter.   Charma Igo, FNP 10/28/21 1521    Charma Igo, FNP 10/28/21 1521

## 2021-10-28 NOTE — ED Triage Notes (Signed)
Pt. c/o right lower back pain and left upper shoulder pain. Pt. Also c/o right ear pain and was recently treated for an ear infection w/ nasal spray.

## 2021-11-18 ENCOUNTER — Ambulatory Visit (INDEPENDENT_AMBULATORY_CARE_PROVIDER_SITE_OTHER): Payer: BC Managed Care – PPO | Admitting: Nurse Practitioner

## 2021-11-18 ENCOUNTER — Encounter: Payer: Self-pay | Admitting: Nurse Practitioner

## 2021-11-18 VITALS — BP 138/72 | HR 87 | Ht <= 58 in | Wt 155.3 lb

## 2021-11-18 DIAGNOSIS — M542 Cervicalgia: Secondary | ICD-10-CM | POA: Diagnosis not present

## 2021-11-18 DIAGNOSIS — I10 Essential (primary) hypertension: Secondary | ICD-10-CM

## 2021-11-18 MED ORDER — CYCLOBENZAPRINE HCL 5 MG PO TABS: 5.0000 mg | ORAL_TABLET | Freq: Three times a day (TID) | ORAL | 0 refills | Status: DC | PRN

## 2021-11-18 MED ORDER — LOSARTAN POTASSIUM-HCTZ 50-12.5 MG PO TABS: 1.0000 | ORAL_TABLET | Freq: Every day | ORAL | 2 refills | Status: DC

## 2021-11-18 NOTE — Progress Notes (Signed)
Established Patient Office Visit  Subjective:  Patient ID: Stephanie Marsh, female    DOB: 1955-09-30  Age: 66 y.o. MRN: [id number]  CC:  Chief Complaint  Patient presents with   Hypertension   Pain    Patient complains of right sided pain that radiates into hip and leg. Patient states that it has been going on for about 3 months pretty continuously.      HPI  [name] presents for hypertension follow up and also complaints of   Neck Pain  This is a recurrent problem. The current episode started more than 1 month ago. The problem occurs daily. The problem has been waxing and waning. The pain is associated with an unknown factor. The pain is present in the right side. The quality of the pain is described as cramping. The pain is at a severity of 4/10. The pain is mild. Nothing aggravates the symptoms. The pain is Worse during the day. Stiffness is present In the morning. Pertinent negatives include no chest pain, fever, numbness or tingling. She has tried acetaminophen for the symptoms. The treatment provided mild relief.     Past Medical History:  Diagnosis Date   Hypertension     Past Surgical History:  Procedure Laterality Date   NO PAST SURGERIES      Family History  Problem Relation Age of Onset   Diabetes Mother    Hypertension Father     Social History   Socioeconomic History   Marital status: Married    Spouse name: Not on file   Number of children: Not on file   Years of education: Not on file   Highest education level: Not on file  Occupational History   Not on file  Tobacco Use   Smoking status: Former    Types: Cigarettes    Quit date: 71    Years since quitting: 29.9   Smokeless tobacco: Never  Vaping Use   Vaping Use: Never used  Substance and Sexual Activity   Alcohol use: Never   Drug use: Never   Sexual activity: Yes  Other Topics Concern   Not on file  Social History Narrative   Not on file   Social Determinants of Health    Financial Resource Strain: Not on file  Food Insecurity: Not on file  Transportation Needs: Not on file  Physical Activity: Not on file  Stress: Not on file  Social Connections: Not on file  Intimate Partner Violence: Not on file     Outpatient Medications Prior to Visit  Medication Sig Dispense Refill   fluticasone (FLONASE) 50 MCG/ACT nasal spray Place 2 sprays into both nostrils daily. 15.8 mL 0   ibuprofen (ADVIL) 800 MG tablet Take 1 tablet (800 mg total) by mouth every 8 (eight) hours as needed. 30 tablet 0   lidocaine (LIDODERM) 5 % Place 1 patch onto the skin every 12 (twelve) hours. Remove & Discard patch within 12 hours or as directed by MD 10 patch 0   loratadine (CLARITIN) 10 MG tablet Take 1 tablet (10 mg total) by mouth daily. 30 tablet 11   methocarbamol (ROBAXIN) 500 MG tablet Take 1 tablet (500 mg total) by mouth 4 (four) times daily. 30 tablet 0   mometasone (ELOCON) 0.1 % cream Apply to aa's face BID x 2 weeks. Then decrease to QD 5 days per week. 50 g 0   losartan-hydrochlorothiazide (HYZAAR) 50-12.5 MG tablet Take 1 tablet by mouth daily. 90 tablet 1  No facility-administered medications prior to visit.    No Known Allergies  ROS Review of Systems  Constitutional:  Negative for fever.  Respiratory:  Negative for chest tightness and shortness of breath.   Cardiovascular:  Negative for chest pain.  Genitourinary: Negative.   Musculoskeletal:  Positive for neck pain.  Skin: Negative.   Neurological:  Negative for tingling and numbness.  Psychiatric/Behavioral: Negative.        Objective:    Physical Exam Constitutional:      Appearance: Normal appearance. She is obese.  HENT:     Head: Normocephalic.     Right Ear: Tympanic membrane normal.     Left Ear: Tympanic membrane normal.     Nose: Nose normal.     Mouth/Throat:     Mouth: Mucous membranes are moist.     Pharynx: Oropharynx is clear.  Eyes:     Extraocular Movements: Extraocular  movements intact.     Conjunctiva/sclera: Conjunctivae normal.     Pupils: Pupils are equal, round, and reactive to light.  Cardiovascular:     Rate and Rhythm: Normal rate and regular rhythm.     Pulses: Normal pulses.     Heart sounds: Normal heart sounds.  Pulmonary:     Effort: Pulmonary effort is normal. No respiratory distress.     Breath sounds: Normal breath sounds. No rhonchi.  Abdominal:     General: Bowel sounds are normal.     Palpations: Abdomen is soft. There is no mass.     Tenderness: There is no abdominal tenderness.     Hernia: No hernia is present.  Musculoskeletal:        General: Normal range of motion.     Cervical back: Tenderness present.  Skin:    General: Skin is warm.     Capillary Refill: Capillary refill takes less than 2 seconds.  Neurological:     General: No focal deficit present.     Mental Status: She is alert and oriented to person, place, and time. Mental status is at baseline.  Psychiatric:        Mood and Affect: Mood normal.        Behavior: Behavior normal.        Thought Content: Thought content normal.        Judgment: Judgment normal.     BP 138/72   Pulse 87   Ht _0  (1.473 m)   Wt 155 lb 4.8 oz (70.4 kg)   BMI 32.46 kg/m  Wt Readings from Last 3 Encounters:  11/18/21 155 lb 4.8 oz (70.4 kg)  09/08/21 157 lb 8 oz (71.4 kg)  08/28/21 158 lb 11.7 oz (72 kg)     Health Maintenance  Topic Date Due   COVID-19 Vaccine (1) Never done   MAMMOGRAM  Never done   DEXA SCAN  Never done   Zoster Vaccines- Shingrix (1 of 2) 02/18/2022 (Originally 05/12/2004)   COLONOSCOPY (Pts 45-33yr Insurance coverage will need to be confirmed)  11/19/2022 (Originally 05/13/1999)   Hepatitis C Screening  11/19/2022 (Originally 05/12/1972)   Pneumonia Vaccine 67 Years old (1 - PCV) 12/06/2022 (Originally 05/13/2019)   INFLUENZA VACCINE  12/06/2022 (Originally 08/16/2021)   DTaP/Tdap/Td (2 - Td or Tdap) 01/16/2026   HPV VACCINES  Aged Out     There are no preventive care reminders to display for this patient.  Lab Results  Component Value Date   TSH 1.300 06/01/2021   Lab Results  Component Value Date  WBC 7.2 06/01/2021   HGB 12.1 06/01/2021   HCT 37.1 06/01/2021   MCV 85 06/01/2021   PLT 232 06/01/2021   Lab Results  Component Value Date   NA 141 06/01/2021   K 3.7 06/01/2021   GLUCOSE 93 06/01/2021   BUN 16 06/01/2021   CREATININE 0.67 06/01/2021   BILITOT 0.3 06/01/2021   ALKPHOS 68 06/01/2021   AST 16 06/01/2021   ALT 15 06/01/2021   PROT 7.0 06/01/2021   ALBUMIN 4.3 06/01/2021   CALCIUM 9.1 06/01/2021   EGFR 96 06/01/2021   Lab Results  Component Value Date   CHOL 194 06/01/2021   Lab Results  Component Value Date   HDL 48 06/01/2021   Lab Results  Component Value Date   LDLCALC 130 (H) 06/01/2021   Lab Results  Component Value Date   TRIG 88 06/01/2021   Lab Results  Component Value Date   CHOLHDL 4.0 06/01/2021   No results found for: "HGBA1C"    Assessment & Plan:   Problem List Items Addressed This Visit       Cardiovascular and Mediastinum   Hypertension    Patient BP  Vitals:   11/18/21 1401 11/18/21 1431  BP: (!) 143/67 138/72    in the office today.  Encouraged her to  follow a low salt, heart healthy diet and follow regular activity schedule. Continue losartan and HCTZ.          Relevant Medications   losartan-hydrochlorothiazide (HYZAAR) 50-12.5 MG tablet     Other   Neck pain - Primary    Advised patient to perform some stretching exercises for neck. Advised her to perform alternate hot and cold pack. Started her on Flexeril 5 mg as needed.        Meds ordered this encounter  Medications   cyclobenzaprine (FLEXERIL) 5 MG tablet    Sig: Take 1 tablet (5 mg total) by mouth 3 (three) times daily as needed for muscle spasms.    Dispense:  30 tablet    Refill:  0   losartan-hydrochlorothiazide (HYZAAR) 50-12.5 MG tablet    Sig: Take 1 tablet by  mouth daily.    Dispense:  30 tablet    Refill:  2     Follow-up: No follow-ups on file.    Theresia Lo, NP

## 2021-12-14 DIAGNOSIS — M542 Cervicalgia: Secondary | ICD-10-CM | POA: Insufficient documentation

## 2021-12-14 NOTE — Assessment & Plan Note (Signed)
Patient BP  Vitals:   11/18/21 1401 11/18/21 1431  BP: (!) 143/67 138/72    in the office today.  Encouraged her to  follow a low salt, heart healthy diet and follow regular activity schedule. Continue losartan and HCTZ.

## 2021-12-14 NOTE — Assessment & Plan Note (Signed)
Advised patient to perform some stretching exercises for neck. Advised her to perform alternate hot and cold pack. Started her on Flexeril 5 mg as needed.

## 2021-12-28 ENCOUNTER — Other Ambulatory Visit: Payer: Self-pay

## 2021-12-28 DIAGNOSIS — I10 Essential (primary) hypertension: Secondary | ICD-10-CM

## 2021-12-28 MED ORDER — LOSARTAN POTASSIUM-HCTZ 50-12.5 MG PO TABS: 1.0000 | ORAL_TABLET | Freq: Every day | ORAL | 1 refills | Status: DC

## 2022-02-16 ENCOUNTER — Ambulatory Visit: Payer: BC Managed Care – PPO | Admitting: Nurse Practitioner

## 2022-02-16 ENCOUNTER — Encounter: Payer: Self-pay | Admitting: Nurse Practitioner

## 2022-02-16 ENCOUNTER — Ambulatory Visit (INDEPENDENT_AMBULATORY_CARE_PROVIDER_SITE_OTHER): Payer: Medicare Other | Admitting: Nurse Practitioner

## 2022-02-16 VITALS — BP 130/78 | HR 75 | Temp 98.2°F | Ht 58.5 in | Wt 155.6 lb

## 2022-02-16 DIAGNOSIS — H9201 Otalgia, right ear: Secondary | ICD-10-CM | POA: Insufficient documentation

## 2022-02-16 DIAGNOSIS — I10 Essential (primary) hypertension: Secondary | ICD-10-CM | POA: Diagnosis not present

## 2022-02-16 DIAGNOSIS — M545 Low back pain, unspecified: Secondary | ICD-10-CM | POA: Diagnosis not present

## 2022-02-16 DIAGNOSIS — G8929 Other chronic pain: Secondary | ICD-10-CM

## 2022-02-16 MED ORDER — MECLIZINE HCL 12.5 MG PO TABS: 12.5000 mg | ORAL_TABLET | Freq: Three times a day (TID) | ORAL | 0 refills | Status: DC | PRN

## 2022-02-16 NOTE — Assessment & Plan Note (Signed)
Pain off and on going on from 6 months. Would refer to ENT.

## 2022-02-16 NOTE — Progress Notes (Signed)
Established Patient Office Visit  Subjective:  Patient ID: Stephanie Marsh, female    DOB: Jul 07, 1955  Age: 67 y.o. MRN: [id number]  CC:  Chief Complaint  Patient presents with   Medical Management of Chronic Issues     HPI  Stephanie Marsh presents for follow up on hypertension, back and neck pain. She states that location of the pain various but it is particularly on the right side. She is using tylenol  as needed for back pain.  She also complaint of right sided ear pain off and on going on from almost 6 months. She denies URI, nasal congestion or fever.  She states that they are going on trip and wanted medication for motion sickness.  HPI   Past Medical History:  Diagnosis Date   Hypertension     Past Surgical History:  Procedure Laterality Date   NO PAST SURGERIES      Family History  Problem Relation Age of Onset   Diabetes Mother    Hypertension Father     Social History   Socioeconomic History   Marital status: Married    Spouse name: Not on file   Number of children: Not on file   Years of education: Not on file   Highest education level: Not on file  Occupational History   Not on file  Tobacco Use   Smoking status: Former    Types: Cigarettes    Quit date: 6    Years since quitting: 30.1   Smokeless tobacco: Never  Vaping Use   Vaping Use: Never used  Substance and Sexual Activity   Alcohol use: Never   Drug use: Never   Sexual activity: Yes  Other Topics Concern   Not on file  Social History Narrative   Not on file   Social Determinants of Health   Financial Resource Strain: Not on file  Food Insecurity: Not on file  Transportation Needs: Not on file  Physical Activity: Not on file  Stress: Not on file  Social Connections: Not on file  Intimate Partner Violence: Not on file     Outpatient Medications Prior to Visit  Medication Sig Dispense Refill   cyclobenzaprine (FLEXERIL) 5 MG tablet Take 1 tablet (5 mg total) by mouth 3  (three) times daily as needed for muscle spasms. 30 tablet 0   losartan-hydrochlorothiazide (HYZAAR) 50-12.5 MG tablet Take 1 tablet by mouth daily. 90 tablet 1   lidocaine (LIDODERM) 5 % Place 1 patch onto the skin every 12 (twelve) hours. Remove & Discard patch within 12 hours or as directed by MD (Patient not taking: Reported on 02/16/2022) 10 patch 0   fluticasone (FLONASE) 50 MCG/ACT nasal spray Place 2 sprays into both nostrils daily. (Patient not taking: Reported on 02/16/2022) 15.8 mL 0   ibuprofen (ADVIL) 800 MG tablet Take 1 tablet (800 mg total) by mouth every 8 (eight) hours as needed. (Patient not taking: Reported on 02/16/2022) 30 tablet 0   loratadine (CLARITIN) 10 MG tablet Take 1 tablet (10 mg total) by mouth daily. (Patient not taking: Reported on 02/16/2022) 30 tablet 11   methocarbamol (ROBAXIN) 500 MG tablet Take 1 tablet (500 mg total) by mouth 4 (four) times daily. (Patient not taking: Reported on 02/16/2022) 30 tablet 0   mometasone (ELOCON) 0.1 % cream Apply to aa's face BID x 2 weeks. Then decrease to QD 5 days per week. (Patient not taking: Reported on 02/16/2022) 50 g 0   No facility-administered medications prior  to visit.    No Known Allergies  ROS Review of Systems  Constitutional: Negative.   HENT:  Positive for ear pain.   Eyes: Negative.   Respiratory: Negative.    Cardiovascular:  Negative for palpitations and leg swelling.  Gastrointestinal: Negative.   Genitourinary: Negative.   Musculoskeletal:  Positive for back pain.  Skin: Negative.   Neurological: Negative.   Psychiatric/Behavioral: Negative.        Objective:    Physical Exam Constitutional:      Appearance: Normal appearance. She is obese.  HENT:     Head: Normocephalic.     Right Ear: Tympanic membrane normal. There is no impacted cerumen.     Left Ear: Tympanic membrane normal. There is no impacted cerumen.     Nose: Nose normal.     Mouth/Throat:     Mouth: Mucous membranes are moist.      Pharynx: Oropharynx is clear.  Eyes:     Extraocular Movements: Extraocular movements intact.     Conjunctiva/sclera: Conjunctivae normal.     Pupils: Pupils are equal, round, and reactive to light.  Cardiovascular:     Rate and Rhythm: Normal rate and regular rhythm.     Pulses: Normal pulses.     Heart sounds: Normal heart sounds.  Pulmonary:     Effort: Pulmonary effort is normal. No respiratory distress.     Breath sounds: Normal breath sounds. No rhonchi.  Abdominal:     General: Bowel sounds are normal.     Palpations: Abdomen is soft. There is no mass.     Tenderness: There is no abdominal tenderness.     Hernia: No hernia is present.  Musculoskeletal:        General: Normal range of motion.     Cervical back: Neck supple. No tenderness.  Skin:    General: Skin is warm.     Capillary Refill: Capillary refill takes less than 2 seconds.  Neurological:     General: No focal deficit present.     Mental Status: She is alert and oriented to person, place, and time. Mental status is at baseline.  Psychiatric:        Mood and Affect: Mood normal.        Behavior: Behavior normal.        Thought Content: Thought content normal.        Judgment: Judgment normal.     BP 130/78   Pulse 75   Temp 98.2 F (36.8 C) (Oral)   Ht 4' 10.5" (1.486 m)   Wt 155 lb 9.6 oz (70.6 kg)   SpO2 96%   BMI 31.97 kg/m  Wt Readings from Last 3 Encounters:  02/16/22 155 lb 9.6 oz (70.6 kg)  11/18/21 155 lb 4.8 oz (70.4 kg)  09/08/21 157 lb 8 oz (71.4 kg)     Health Maintenance  Topic Date Due   Medicare Annual Wellness (AWV)  Never done   COVID-19 Vaccine (1) Never done   MAMMOGRAM  Never done   DEXA SCAN  Never done   Zoster Vaccines- Shingrix (1 of 2) 02/18/2022 (Originally 05/12/2004)   COLONOSCOPY (Pts 45-37yrs Insurance coverage will need to be confirmed)  11/19/2022 (Originally 05/13/1999)   Hepatitis C Screening  11/19/2022 (Originally 05/12/1972)   Pneumonia Vaccine 50+ Years old  (1 - PCV) 12/06/2022 (Originally 05/13/2019)   INFLUENZA VACCINE  12/06/2022 (Originally 08/16/2021)   DTaP/Tdap/Td (2 - Td or Tdap) 01/16/2026   HPV VACCINES  Aged Out  There are no preventive care reminders to display for this patient.  Lab Results  Component Value Date   TSH 1.300 06/01/2021   Lab Results  Component Value Date   WBC 7.2 06/01/2021   HGB 12.1 06/01/2021   HCT 37.1 06/01/2021   MCV 85 06/01/2021   PLT 232 06/01/2021   Lab Results  Component Value Date   NA 141 06/01/2021   K 3.7 06/01/2021   GLUCOSE 93 06/01/2021   BUN 16 06/01/2021   CREATININE 0.67 06/01/2021   BILITOT 0.3 06/01/2021   ALKPHOS 68 06/01/2021   AST 16 06/01/2021   ALT 15 06/01/2021   PROT 7.0 06/01/2021   ALBUMIN 4.3 06/01/2021   CALCIUM 9.1 06/01/2021   EGFR 96 06/01/2021   Lab Results  Component Value Date   CHOL 194 06/01/2021   Lab Results  Component Value Date   HDL 48 06/01/2021   Lab Results  Component Value Date   LDLCALC 130 (H) 06/01/2021   Lab Results  Component Value Date   TRIG 88 06/01/2021   Lab Results  Component Value Date   CHOLHDL 4.0 06/01/2021   No results found for: "HGBA1C"    Assessment & Plan:   Problem List Items Addressed This Visit       Cardiovascular and Mediastinum   Hypertension    BP 130/78 in the office today. Continue losartan/HCTZ 50 -12.5 mg daily. Will continue to monitor.        Other   Chronic right-sided low back pain without sciatica   Otalgia of right ear - Primary    Pain off and on going on from 6 months. Would refer to ENT.      Relevant Orders   Ambulatory referral to ENT     Meds ordered this encounter  Medications   meclizine (ANTIVERT) 12.5 MG tablet    Sig: Take 1 tablet (12.5 mg total) by mouth 3 (three) times daily as needed for dizziness.    Dispense:  30 tablet    Refill:  0     Follow-up: Return in about 4 months (around 06/17/2022).    Theresia Lo, NP

## 2022-02-16 NOTE — Patient Instructions (Addendum)
Referred to ENT for ear pain. Continue the Blood pressure medication. For back pain see [facility].  Rx of meclizine for motion sickness.

## 2022-02-17 NOTE — Assessment & Plan Note (Signed)
BP 130/78 in the office today. Continue losartan/HCTZ 50 -12.5 mg daily. Will continue to monitor.

## 2022-02-17 NOTE — Assessment & Plan Note (Signed)
She has chronic back pain, shoulder and neck pain off and on. Would refer her for physical therapy.

## 2022-03-21 ENCOUNTER — Telehealth: Payer: Self-pay | Admitting: Nurse Practitioner

## 2022-03-21 ENCOUNTER — Other Ambulatory Visit: Payer: Self-pay

## 2022-03-21 DIAGNOSIS — I10 Essential (primary) hypertension: Secondary | ICD-10-CM

## 2022-03-21 MED ORDER — LOSARTAN POTASSIUM-HCTZ 50-12.5 MG PO TABS: 1.0000 | ORAL_TABLET | Freq: Every day | ORAL | 1 refills | Status: DC

## 2022-03-21 NOTE — Telephone Encounter (Signed)
sent 

## 2022-03-21 NOTE — Telephone Encounter (Signed)
Prescription Request  03/21/2022  LOV: 02/16/2022  What is the name of the medication or equipment? losartan-hydrochlorothiazide (HYZAAR) 50-12.5 MG tablet  Have you contacted your pharmacy to request a refill? Yes   Which pharmacy would you like this sent to?    [facility] [place] - [address] [place] ([place]) [place] [address] Phone: [phone] Fax: [phone]    Patient notified that their request is being sent to the clinical staff for review and that they should receive a response within 2 business days.   Please advise at Mobile [phone] (mobile)

## 2022-03-23 ENCOUNTER — Other Ambulatory Visit: Payer: Self-pay | Admitting: *Deleted

## 2022-03-23 DIAGNOSIS — H9201 Otalgia, right ear: Secondary | ICD-10-CM | POA: Diagnosis not present

## 2022-03-23 DIAGNOSIS — I10 Essential (primary) hypertension: Secondary | ICD-10-CM

## 2022-03-23 MED ORDER — LOSARTAN POTASSIUM-HCTZ 50-12.5 MG PO TABS: 1.0000 | ORAL_TABLET | Freq: Every day | ORAL | 1 refills | Status: DC

## 2022-03-23 NOTE — Telephone Encounter (Signed)
Pt called stating [facility] texted her stating her lorsartan was ready but she does not go to [facility] anymore.

## 2022-03-23 NOTE — Telephone Encounter (Signed)
It was supposed to go to [facility] as mentioned in the original message.  I have resent it to the correct pharmacy & pt aware.

## 2022-03-27 ENCOUNTER — Ambulatory Visit
Admission: RE | Admit: 2022-03-27 | Discharge: 2022-03-27 | Disposition: A | Discharge status: Home / Self Care | Payer: BC Managed Care – PPO | Source: Ambulatory Visit | Attending: Internal Medicine | Admitting: Internal Medicine

## 2022-03-27 DIAGNOSIS — Z1231 Encounter for screening mammogram for malignant neoplasm of breast: Secondary | ICD-10-CM | POA: Insufficient documentation

## 2022-04-25 ENCOUNTER — Telehealth: Payer: Self-pay

## 2022-04-25 ENCOUNTER — Telehealth: Payer: Self-pay | Admitting: Nurse Practitioner

## 2022-04-25 NOTE — Telephone Encounter (Signed)
Patient states she is returning our call.  I let her know that I do not see a message in our system where anyone called, but I will make a note so they can call her back when they see it.

## 2022-04-25 NOTE — Telephone Encounter (Signed)
Copied from CRM [id number]. Topic: Medicare AWV >> Apr 25, 2022 12:02 PM Rushie Goltz wrote: Reason for CRM: Called patient to schedule Medicare Annual Wellness Visit (AWV). No voicemail available to leave a message.  Last date of AWV: AWVI eligible as of 04/16/2020  Please schedule an AWVI appointment at any time with [facility] VISIT.  If any questions, please contact me at [phone].    Thank you,  [facility] Support [facility] Direct dial  [phone]

## 2022-04-26 NOTE — Telephone Encounter (Signed)
Per previous phone note patient was called to schedule a AWV (initial). Please schedule  Copied from CRM [id number]. Topic: Medicare AWV >> Apr 25, 2022 12:02 PM Rushie Goltz wrote: Reason for CRM: Called patient to schedule Medicare Annual Wellness Visit (AWV). No voicemail available to leave a message.   Last date of AWV: AWVI eligible as of 04/16/2020   Please schedule an AWVI appointment at any time with [facility] VISIT.   If any questions, please contact me at [phone].       Thank you,   [facility] Support [facility] Direct dial  [phone]

## 2022-05-03 ENCOUNTER — Encounter: Payer: Self-pay | Admitting: Nurse Practitioner

## 2022-05-03 ENCOUNTER — Telehealth: Payer: Self-pay

## 2022-05-03 NOTE — Telephone Encounter (Signed)
I called patient today to schedule a Medicare AWV, but patient's mailbox is full.  I mailed a letter to patient asking her to please call us to schedule this appointment.  Patient does not have MyChart.

## 2022-05-25 ENCOUNTER — Telehealth: Payer: Self-pay | Admitting: Nurse Practitioner

## 2022-05-25 NOTE — Telephone Encounter (Signed)
Pt need a refill on BP medication sent to [facility]

## 2022-05-25 NOTE — Telephone Encounter (Signed)
Per chart refill sent 03/23/22, #90, 1 rf  Spoke with patient and she has not picked up that refill. She has not contacted [facility]. Advised patient to call pharmacy to inquire about picking up rx. She will call them. Nothing further needed at this time.

## 2022-08-17 ENCOUNTER — Other Ambulatory Visit: Payer: Self-pay

## 2022-08-17 ENCOUNTER — Emergency Department
Admission: EM | Admit: 2022-08-17 | Discharge: 2022-08-17 | Disposition: A | Discharge status: Home / Self Care | Payer: BC Managed Care – PPO | Attending: Emergency Medicine | Admitting: Emergency Medicine

## 2022-08-17 DIAGNOSIS — H9191 Unspecified hearing loss, right ear: Secondary | ICD-10-CM | POA: Diagnosis not present

## 2022-08-17 DIAGNOSIS — M5441 Lumbago with sciatica, right side: Secondary | ICD-10-CM | POA: Insufficient documentation

## 2022-08-17 DIAGNOSIS — R42 Dizziness and giddiness: Secondary | ICD-10-CM | POA: Insufficient documentation

## 2022-08-17 DIAGNOSIS — H81391 Other peripheral vertigo, right ear: Secondary | ICD-10-CM | POA: Diagnosis not present

## 2022-08-17 DIAGNOSIS — M5431 Sciatica, right side: Secondary | ICD-10-CM

## 2022-08-17 MED ORDER — MECLIZINE HCL 12.5 MG PO TABS: 25.0000 mg | ORAL_TABLET | Freq: Three times a day (TID) | ORAL | 0 refills | Status: AC | PRN

## 2022-08-17 MED ORDER — PREDNISONE 20 MG PO TABS
40.0000 mg | ORAL_TABLET | Freq: Every day | ORAL | 0 refills | Status: AC
Start: 2022-08-17 — End: 2022-08-22

## 2022-08-17 MED ORDER — BACLOFEN 10 MG PO TABS: 10.0000 mg | ORAL_TABLET | Freq: Every day | ORAL | 0 refills | Status: AC

## 2022-08-17 NOTE — Discharge Instructions (Addendum)
Your exam is consistent with vertigo and will be treated with meclizine.  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  That will be treated with prednisone.  May take OTC Tylenol for additional back and shoulder pain relief.  You additionally had evidence of a thin veil of wax in your right ear.  This may have been the cause of your vertigo symptoms.  Use OTC Debrox earwax solution to cleanse the ears once or twice a month.  Follow-up with your primary provider for ongoing concerns.  Return to the ED if needed.

## 2022-08-17 NOTE — ED Triage Notes (Signed)
Pt reports R sided back pain from ear to knee x months. Pt reports she has been seen for this several times and nothing has helped. Pt denies injury.

## 2022-08-17 NOTE — ED Provider Notes (Signed)
[facility] Emergency Department Provider Note     Event Date/Time   First MD Initiated Contact with Patient 08/17/22 2001     (approximate)   History   Back Pain (Pt reports R back sided pain from ear to mid leg)   HPI  Stephanie Marsh is a 67 y.o. female presents to the ED with intermittent complaints of right ear decreased hearing as well as some right-sided low back pain.  Patient has been evaluated by multiple providers but denies any significant benefit.  She has also reported some vertigo that she noted this morning had a hard time getting her head out of bed.  She has had previous episodes of vertigo, and chart review reveals a previous prescription for meclizine.  Patient denies any fever, chills, sweats, chest pain, shortness of breath.  The back pain the right side does refer down the posterior right lower leg.  No bladder or bowel incontinence, foot drop, or saddle anesthesia reported.     Physical Exam   Triage Vital Signs: ED Triage Vitals  Encounter Vitals Group     BP 08/17/22 1857 (!) 163/84     Systolic BP Percentile --      Diastolic BP Percentile --      Pulse Rate 08/17/22 1857 69     Resp 08/17/22 1857 15     Temp 08/17/22 1857 98.6 F (37 C)     Temp Source 08/17/22 1857 Oral     SpO2 08/17/22 1857 97 %     Weight 08/17/22 1858 155 lb (70.3 kg)     Height 08/17/22 1858 4\' 10"  (1.473 m)     Head Circumference --      Peak Flow --      Pain Score 08/17/22 1858 7     Pain Loc --      Pain Education --      Exclude from Growth Chart --     Most recent vital signs: Vitals:   08/17/22 1857 08/17/22 2055  BP: (!) 163/84 (!) 161/80  Pulse: 69 60  Resp: 15 16  Temp: 98.6 F (37 C)   SpO2: 97% 99%    General Awake, no distress. NAD HEENT NCAT. PERRL. EOMI. No rhinorrhea. Mucous membranes are moist.  Right TM obscured by a thin veil of wax.  I was able to distract the wax veil to reveal an intact and stable right TM.  Left  TM intact without effusion or rupture.  Normal gross hearing bilaterally. CV:  Good peripheral perfusion. RRR RESP:  Normal effort. CTA ABD:  No distention.  MSK:  Normal spinal alignment without midline tenderness, spasm, vomiting, or step-off.  Patient tenderness palpation to the right lumbosacral junction.  Normal flexion extension range of the lower extremities bilaterally. NEURO: Cranial nerves II to XII grossly intact.  Normal LE DTRs bilaterally.  Negative supine straight leg raise bilaterally.   ED Results / Procedures / Treatments   Labs (all labs ordered are listed, but only abnormal results are displayed) Labs Reviewed - No data to display   EKG   RADIOLOGY   No results found.   PROCEDURES:  Critical Care performed: No  Procedures   MEDICATIONS ORDERED IN ED: Medications - No data to display   IMPRESSION / MDM / ASSESSMENT AND PLAN / ED COURSE  I reviewed the triage vital signs and the nursing notes.  Differential diagnosis includes, but is not limited to, AOM, TM rupture, serous effusion, lumbar strain, lumbar radiculopathy, myalgias  Patient's presentation is most consistent with acute, uncomplicated illness.  Patient's diagnosis is consistent with acute right hearing affected by cerumen.  Symptoms resolved after cerumen is cleared from the ear canal revealing an stable intact TM on the right.  Right-sided lumbosacral symptoms with some RLE referral pain likely due to lumbar radiculopathy and underlying DDD, spondylolisthesis.  Evidence of prior plain film imaging of the patient's chart.  We discussed the clinical diagnosis likely result representing DDD and some mild sciatic nerve irritation.  This is based on prior imaging of the patient's cervical spine which does confirm some DDD.  No red flags on exam.  No signs of spinal cord compression or HNP.  Patient declined offers for plain films of the lumbar spine at this time.   Symptoms of vertigo likely affected by decreased hearing the right.  Patient will be prescribed meclizine, to help with symptoms.  Patient will be discharged home with prescriptions for baclofen for muscle pain as well as prednisone for anti-inflammatory benefit. Patient is to follow up with her primary providers as needed or otherwise directed. Patient is given ED precautions to return to the ED for any worsening or new symptoms.   FINAL CLINICAL IMPRESSION(S) / ED DIAGNOSES   Final diagnoses:  Vertigo  Decreased hearing of right ear  Sciatica of right side     Rx / DC Orders   ED Discharge Orders          Ordered    meclizine (ANTIVERT) 12.5 MG tablet  3 times daily PRN        08/17/22 2032    predniSONE (DELTASONE) 20 MG tablet  Daily with breakfast        08/17/22 2032    baclofen (LIORESAL) 10 MG tablet  Daily        08/17/22 2032             Note:  This document was prepared using Dragon voice recognition software and may include unintentional dictation errors.    Lissa Hoard, PA-C 08/20/22 1146    Jene Every, MD 08/28/22 [phone]

## 2022-08-24 ENCOUNTER — Telehealth: Payer: Self-pay | Admitting: *Deleted

## 2022-08-24 NOTE — Telephone Encounter (Signed)
Transition Care Management Unsuccessful Follow-up Telephone Call  Date of discharge and from where:  [facility]  08/17/2022  Attempts:  1st Attempt  Reason for unsuccessful TCM follow-up call:  No answer/busy

## 2022-08-25 ENCOUNTER — Telehealth: Payer: Self-pay | Admitting: *Deleted

## 2022-08-25 NOTE — Telephone Encounter (Signed)
Transition Care Management Unsuccessful Follow-up Telephone Call  Date of discharge and from where:  [facility] 08/17/2022  Attempts:  2nd Attempt  Reason for unsuccessful TCM follow-up call:  No answer/busy

## 2022-09-21 ENCOUNTER — Telehealth: Payer: Self-pay

## 2022-09-21 NOTE — Telephone Encounter (Signed)
Prescription Request  09/21/2022  LOV: Visit date not found  What is the name of the medication or equipment? losartan-hydrochlorothiazide (HYZAAR) 50-12.5 MG tablet  Have you contacted your pharmacy to request a refill? Yes   Which pharmacy would you like this sent to?  [facility] [address] ([address]), [place] - [address] [place]) [place] [address] Phone: [phone] Fax: [phone]    Patient notified that their request is being sent to the clinical staff for review and that they should receive a response within 2 business days.   Please advise at Mobile [phone] (mobile)  Patient states she spoke with her pharmacy and she has no refills left.

## 2022-09-22 ENCOUNTER — Other Ambulatory Visit: Payer: Self-pay | Admitting: Nurse Practitioner

## 2022-09-22 DIAGNOSIS — I10 Essential (primary) hypertension: Secondary | ICD-10-CM

## 2022-09-22 DIAGNOSIS — E669 Obesity, unspecified: Secondary | ICD-10-CM

## 2022-09-22 DIAGNOSIS — E559 Vitamin D deficiency, unspecified: Secondary | ICD-10-CM

## 2022-09-22 MED ORDER — LOSARTAN POTASSIUM-HCTZ 50-12.5 MG PO TABS: 1.0000 | ORAL_TABLET | Freq: Every day | ORAL | 0 refills | Status: DC

## 2022-09-22 NOTE — Telephone Encounter (Signed)
Called Patient to schedule her and she hung up so I called back then she kept asking where am I calling from then she said she is at work and does not know when she can come for a follow up but she is at work and hung up again.

## 2022-09-22 NOTE — Telephone Encounter (Signed)
Patient called back and scheduled an appointment with Stephanie Marsh next week.

## 2022-09-22 NOTE — Telephone Encounter (Signed)
30-day supply has been refilled please advise patient to schedule appointment for follow-up and labs.

## 2022-09-26 ENCOUNTER — Encounter: Payer: Self-pay | Admitting: Nurse Practitioner

## 2022-09-26 ENCOUNTER — Ambulatory Visit (INDEPENDENT_AMBULATORY_CARE_PROVIDER_SITE_OTHER): Payer: Medicare Other | Admitting: Nurse Practitioner

## 2022-09-26 VITALS — BP 136/82 | HR 69 | Temp 98.1°F | Ht <= 58 in | Wt 157.0 lb

## 2022-09-26 DIAGNOSIS — E559 Vitamin D deficiency, unspecified: Secondary | ICD-10-CM | POA: Diagnosis not present

## 2022-09-26 DIAGNOSIS — G8929 Other chronic pain: Secondary | ICD-10-CM

## 2022-09-26 DIAGNOSIS — Z6832 Body mass index (BMI) 32.0-32.9, adult: Secondary | ICD-10-CM | POA: Diagnosis not present

## 2022-09-26 DIAGNOSIS — I10 Essential (primary) hypertension: Secondary | ICD-10-CM

## 2022-09-26 DIAGNOSIS — M545 Low back pain, unspecified: Secondary | ICD-10-CM | POA: Diagnosis not present

## 2022-09-26 DIAGNOSIS — E669 Obesity, unspecified: Secondary | ICD-10-CM

## 2022-09-26 LAB — LIPID PANEL
Cholesterol: 168 mg/dL (ref 0–200)
HDL: 47.4 mg/dL (ref >39.00)
LDL Cholesterol: 84 mg/dL (ref 0–99)
NonHDL: 120.52
Total CHOL/HDL Ratio: 4
Triglycerides: 181 mg/dL — ABNORMAL HIGH (ref 0.0–149.0)
VLDL: 36.2 mg/dL (ref 0.0–40.0)

## 2022-09-26 LAB — CBC WITH DIFFERENTIAL/PLATELET
Basophils Absolute: 0 10*3/uL (ref 0.0–0.1)
Basophils Relative: 0.2 % (ref 0.0–3.0)
Eosinophils Absolute: 0.1 10*3/uL (ref 0.0–0.7)
Eosinophils Relative: 0.8 % (ref 0.0–5.0)
HCT: 36.4 % (ref 36.0–46.0)
Hemoglobin: 11.4 g/dL — ABNORMAL LOW (ref 12.0–15.0)
Lymphocytes Relative: 36.1 % (ref 12.0–46.0)
Lymphs Abs: 2.3 10*3/uL (ref 0.7–4.0)
MCHC: 31.3 g/dL (ref 30.0–36.0)
MCV: 87.3 fl (ref 78.0–100.0)
Monocytes Absolute: 0.4 10*3/uL (ref 0.1–1.0)
Monocytes Relative: 6.7 % (ref 3.0–12.0)
Neutro Abs: 3.6 10*3/uL (ref 1.4–7.7)
Neutrophils Relative %: 56.2 % (ref 43.0–77.0)
Platelets: 245 10*3/uL (ref 150.0–400.0)
RBC: 4.17 Mil/uL (ref 3.87–5.11)
RDW: 13.5 % (ref 11.5–15.5)
WBC: 6.4 10*3/uL (ref 4.0–10.5)

## 2022-09-26 LAB — COMPREHENSIVE METABOLIC PANEL
ALT: 13 U/L (ref 0–35)
AST: 17 U/L (ref 0–37)
Albumin: 3.9 g/dL (ref 3.5–5.2)
Alkaline Phosphatase: 52 U/L (ref 39–117)
BUN: 16 mg/dL (ref 6–23)
CO2: 29 meq/L (ref 19–32)
Calcium: 9.5 mg/dL (ref 8.4–10.5)
Chloride: 103 meq/L (ref 96–112)
Creatinine, Ser: 0.66 mg/dL (ref 0.40–1.20)
GFR: 90.17 mL/min (ref >60.00)
Glucose, Bld: 94 mg/dL (ref 70–99)
Potassium: 3.6 meq/L (ref 3.5–5.1)
Sodium: 140 meq/L (ref 135–145)
Total Bilirubin: 0.3 mg/dL (ref 0.2–1.2)
Total Protein: 6.9 g/dL (ref 6.0–8.3)

## 2022-09-26 LAB — TSH: TSH: 1.1 u[IU]/mL (ref 0.35–5.50)

## 2022-09-26 LAB — VITAMIN D 25 HYDROXY (VIT D DEFICIENCY, FRACTURES): VITD: 10.08 ng/mL — ABNORMAL LOW (ref 30.00–100.00)

## 2022-09-26 NOTE — Patient Instructions (Addendum)
Please go to the lab for blood work. Perform stretching exercises. Referral sent to chiropractor

## 2022-09-26 NOTE — Progress Notes (Signed)
Established Patient Office Visit  Subjective:  Patient ID: Stephanie Marsh, female    DOB: 1955/12/26  Age: 67 y.o. MRN: [id number]  CC:  Chief Complaint  Patient presents with   Medical Management of Chronic Issues    HPI  Stephanie Marsh presents for Chronic management. She check BP at home and the readings are 130s/70-80.  She also has back pain is moslty on the right side.  Had tried meloxicam in the past without significant improvement.   She declined for colonoscopy and cologuard.    HPI   Past Medical History:  Diagnosis Date   Hypertension     Past Surgical History:  Procedure Laterality Date   NO PAST SURGERIES      Family History  Problem Relation Age of Onset   Diabetes Mother    Hypertension Father     Social History   Socioeconomic History   Marital status: Married    Spouse name: Not on file   Number of children: Not on file   Years of education: Not on file   Highest education level: Not on file  Occupational History   Not on file  Tobacco Use   Smoking status: Former    Current packs/day: 0.00    Types: Cigarettes    Quit date: 61    Years since quitting: 30.7   Smokeless tobacco: Never  Vaping Use   Vaping status: Never Used  Substance and Sexual Activity   Alcohol use: Never   Drug use: Never   Sexual activity: Yes  Other Topics Concern   Not on file  Social History Narrative   Not on file   Social Determinants of Health   Financial Resource Strain: Not on file  Food Insecurity: Not on file  Transportation Needs: Not on file  Physical Activity: Not on file  Stress: Not on file  Social Connections: Not on file  Intimate Partner Violence: Not on file     Outpatient Medications Prior to Visit  Medication Sig Dispense Refill   losartan-hydrochlorothiazide (HYZAAR) 50-12.5 MG tablet Take 1 tablet by mouth daily. 30 tablet 0   No facility-administered medications prior to visit.    No Known Allergies  ROS Review of  Systems Negative unless indicated in HPI.    Objective:    Physical Exam Constitutional:      Appearance: Normal appearance.  HENT:     Right Ear: Tympanic membrane normal.     Left Ear: Tympanic membrane normal.     Mouth/Throat:     Mouth: Mucous membranes are moist.  Eyes:     Conjunctiva/sclera: Conjunctivae normal.     Pupils: Pupils are equal, round, and reactive to light.  Cardiovascular:     Rate and Rhythm: Normal rate and regular rhythm.     Pulses: Normal pulses.     Heart sounds: Normal heart sounds.  Pulmonary:     Effort: Pulmonary effort is normal.     Breath sounds: Normal breath sounds.  Abdominal:     General: Bowel sounds are normal.     Palpations: Abdomen is soft.  Musculoskeletal:     Cervical back: Normal range of motion. No tenderness.     Comments: Negative straight leg raise.  Skin:    General: Skin is warm.     Findings: No bruising.  Neurological:     General: No focal deficit present.     Mental Status: She is alert and oriented to person, place, and time. Mental  status is at baseline.  Psychiatric:        Mood and Affect: Mood normal.        Behavior: Behavior normal.        Thought Content: Thought content normal.        Judgment: Judgment normal.     BP 136/82   Pulse 69   Temp 98.1 F (36.7 C)   Ht 4\' 10"  (1.473 m)   Wt 157 lb (71.2 kg)   SpO2 95%   BMI 32.81 kg/m  Wt Readings from Last 3 Encounters:  09/26/22 157 lb (71.2 kg)  08/17/22 155 lb (70.3 kg)  02/16/22 155 lb 9.6 oz (70.6 kg)     Health Maintenance  Topic Date Due   Medicare Annual Wellness (AWV)  Never done   Zoster Vaccines- Shingrix (1 of 2) Never done   DEXA SCAN  Never done   COVID-19 Vaccine (1 - 2023-24 season) 10/12/2022 (Originally 09/17/2022)   Colonoscopy  11/19/2022 (Originally 05/13/1999)   Hepatitis C Screening  11/19/2022 (Originally 05/12/1972)   Pneumonia Vaccine 32+ Years old (1 of 1 - PCV) 12/06/2022 (Originally 05/13/2019)   INFLUENZA  VACCINE  04/16/2023 (Originally 08/17/2022)   MAMMOGRAM  03/26/2024   DTaP/Tdap/Td (2 - Td or Tdap) 01/16/2026   HPV VACCINES  Aged Out    There are no preventive care reminders to display for this patient.  Lab Results  Component Value Date   TSH 1.10 09/26/2022   Lab Results  Component Value Date   WBC 6.4 09/26/2022   HGB 11.4 (L) 09/26/2022   HCT 36.4 09/26/2022   MCV 87.3 09/26/2022   PLT 245.0 09/26/2022   Lab Results  Component Value Date   NA 140 09/26/2022   K 3.6 09/26/2022   CO2 29 09/26/2022   GLUCOSE 94 09/26/2022   BUN 16 09/26/2022   CREATININE 0.66 09/26/2022   BILITOT 0.3 09/26/2022   ALKPHOS 52 09/26/2022   AST 17 09/26/2022   ALT 13 09/26/2022   PROT 6.9 09/26/2022   ALBUMIN 3.9 09/26/2022   CALCIUM 9.5 09/26/2022   EGFR 96 06/01/2021   GFR 90.17 09/26/2022   Lab Results  Component Value Date   CHOL 168 09/26/2022   Lab Results  Component Value Date   HDL 47.40 09/26/2022   Lab Results  Component Value Date   LDLCALC 84 09/26/2022   Lab Results  Component Value Date   TRIG 181.0 (H) 09/26/2022   Lab Results  Component Value Date   CHOLHDL 4 09/26/2022   No results found for: "HGBA1C"    Assessment & Plan:  Hypertension, unspecified type Assessment & Plan: BP 130/78 in the office today. Continue losartan/HCTZ 50 -12.5 mg daily. Will continue to monitor.  Orders: -     TSH -     Comprehensive metabolic panel -     CBC with Differential/Platelet -     Lipid panel  Vitamin D deficiency Assessment & Plan: Will check vitamin D levels.  Orders: -     VITAMIN D 25 Hydroxy (Vit-D Deficiency, Fractures)  Class 1 obesity without serious comorbidity with body mass index (BMI) of 32.0 to 32.9 in adult, unspecified obesity type Assessment & Plan: Body mass index is 32.81 kg/m. Advised pt to lose weight. Advised patient to avoid trans fat, fatty and fried food. Follow a regular physical activity schedule. Went over the risk of  chronic diseases with increased weight.       Orders: -  TSH -     Comprehensive metabolic panel -     CBC with Differential/Platelet -     Lipid panel  Chronic right-sided low back pain without sciatica Assessment & Plan: Advised patient to perform stretching exercises. Alternate hot and cold pack. Referral sent to chiropractor.  Orders: -     Ambulatory referral to Chiropractic    Follow-up: No follow-ups on file.   Kara Dies, NP

## 2022-09-27 ENCOUNTER — Other Ambulatory Visit: Payer: Self-pay | Admitting: Nurse Practitioner

## 2022-09-27 ENCOUNTER — Other Ambulatory Visit (INDEPENDENT_AMBULATORY_CARE_PROVIDER_SITE_OTHER): Payer: BC Managed Care – PPO

## 2022-09-27 DIAGNOSIS — D649 Anemia, unspecified: Secondary | ICD-10-CM | POA: Diagnosis not present

## 2022-09-27 LAB — IBC + FERRITIN
Ferritin: 257.1 ng/mL (ref 10.0–291.0)
Iron: 51 ug/dL (ref 42–145)
Saturation Ratios: 15.2 % — ABNORMAL LOW (ref 20.0–50.0)
TIBC: 334.6 ug/dL (ref 250.0–450.0)
Transferrin: 239 mg/dL (ref 212.0–360.0)

## 2022-09-27 LAB — VITAMIN B12: Vitamin B-12: 263 pg/mL (ref 211–911)

## 2022-09-27 NOTE — Progress Notes (Signed)
Hemoglobin is on the lower side. Vitamin D in the low side have sent a vitamin D supplement take it weekly for 3 months and we will recheck. Liver, kidneys, electrolytes and thyroid normal cholesterol elevated: Manage diet and exercise.

## 2022-09-27 NOTE — Addendum Note (Signed)
Addended by: Warden Fillers on: 09/27/2022 08:00 AM   Modules accepted: Orders

## 2022-09-27 NOTE — Progress Notes (Signed)
Please call patient and inform her: The iron saturation is on the lower side. I have send 325 mg of iron supplement to the pharmacy.  Do not take any antacid, milk products or caffeine drinks within 2 hours after taking the iron supplement.  You can iron supplement with vitamin C like orange juice to increase the absorption.

## 2022-09-28 ENCOUNTER — Telehealth: Payer: Self-pay | Admitting: *Deleted

## 2022-09-28 ENCOUNTER — Other Ambulatory Visit: Payer: Self-pay | Admitting: Nurse Practitioner

## 2022-09-28 MED ORDER — FERROUS SULFATE 325 (65 FE) MG PO TABS: 325.0000 mg | ORAL_TABLET | Freq: Every day | ORAL | 3 refills | Status: DC

## 2022-09-28 MED ORDER — VITAMIN D (ERGOCALCIFEROL) 1.25 MG (50000 UNIT) PO CAPS: 50000.0000 [IU] | ORAL_CAPSULE | ORAL | 0 refills | Status: AC

## 2022-09-28 NOTE — Telephone Encounter (Signed)
Unable to reach pt-mailbox full (Pt has two result notes)

## 2022-09-28 NOTE — Telephone Encounter (Signed)
-----   Message from Kara Dies sent at 09/27/2022  8:48 PM EDT ----- Please call patient and inform her: The iron saturation is on the lower side. I have send 325 mg of iron supplement to the pharmacy.  Do not take any antacid, milk products or caffeine drinks within 2 hours after taking the iron supplement.  You can iron supplement with vitamin C like orange juice to increase the absorption.

## 2022-09-28 NOTE — Telephone Encounter (Signed)
-----   Message from Kara Dies sent at 09/27/2022  8:54 PM EDT ----- Hemoglobin is on the lower side. Vitamin D in the low side have sent a vitamin D supplement take it weekly for 3 months and we will recheck. Liver, kidneys, electrolytes and thyroid normal cholesterol elevated: Manage diet and exercise.

## 2022-10-02 NOTE — Telephone Encounter (Signed)
-----  Message from Kara Dies sent at 09/27/2022  8:48 PM EDT ----- Please call patient and inform her: The iron saturation is on the lower side. I have send 325 mg of iron supplement to the pharmacy.  Do not take any antacid, milk products or caffeine drinks within 2 hours after taking the iron supplement.  You can iron supplement with vitamin C like orange juice to increase the absorption.

## 2022-10-02 NOTE — Telephone Encounter (Signed)
Mailbox is full unable to reach pt

## 2022-10-02 NOTE — Telephone Encounter (Signed)
Pt called and I read the note and she verbalized understanding

## 2022-10-03 NOTE — Telephone Encounter (Signed)
Noted, thanks!

## 2022-10-08 ENCOUNTER — Encounter: Payer: Self-pay | Admitting: Nurse Practitioner

## 2022-10-08 NOTE — Assessment & Plan Note (Signed)
Advised patient to perform stretching exercises. Alternate hot and cold pack. Referral sent to chiropractor.

## 2022-10-08 NOTE — Assessment & Plan Note (Signed)
Body mass index is 32.81 kg/m. Advised pt to lose weight. Advised patient to avoid trans fat, fatty and fried food. Follow a regular physical activity schedule. Went over the risk of chronic diseases with increased weight.

## 2022-10-08 NOTE — Assessment & Plan Note (Signed)
-  Will check vitamin D levels.

## 2022-10-08 NOTE — Assessment & Plan Note (Signed)
BP 130/78 in the office today. Continue losartan/HCTZ 50 -12.5 mg daily. Will continue to monitor.

## 2022-10-10 NOTE — Addendum Note (Signed)
Addended by: Kara Dies on: 10/10/2022 05:12 PM   Modules accepted: Orders

## 2022-10-23 ENCOUNTER — Telehealth: Payer: Self-pay | Admitting: Nurse Practitioner

## 2022-10-23 NOTE — Telephone Encounter (Signed)
Prescription Request  10/23/2022  LOV: 09/26/2022  What is the name of the medication or equipment?  losartan-hydrochlorothiazide (HYZAAR) 50-12.5 MG tablet   Have you contacted your pharmacy to request a refill? Yes   Which pharmacy would you like this sent to?  [facility] [address] ([address]), [place] - [address] [place]) [place] [address] Phone: [phone] Fax: [phone]    Patient notified that their request is being sent to the clinical staff for review and that they should receive a response within 2 business days.   Please advise at [facility] [phone]

## 2022-10-24 ENCOUNTER — Other Ambulatory Visit: Payer: Self-pay

## 2022-10-24 DIAGNOSIS — I10 Essential (primary) hypertension: Secondary | ICD-10-CM

## 2022-10-24 MED ORDER — LOSARTAN POTASSIUM-HCTZ 50-12.5 MG PO TABS: 1.0000 | ORAL_TABLET | Freq: Every day | ORAL | 1 refills | Status: DC

## 2022-10-24 NOTE — Telephone Encounter (Signed)
Medication sent to pharmacy.   Unable to leave vm due to vm being full

## 2022-11-17 DIAGNOSIS — M5416 Radiculopathy, lumbar region: Secondary | ICD-10-CM | POA: Diagnosis not present

## 2022-11-20 ENCOUNTER — Telehealth: Payer: Self-pay | Admitting: Nurse Practitioner

## 2022-11-20 NOTE — Telephone Encounter (Signed)
Patient is asking if the Gabapentin will interact with her other medications.Her back specialist prescribed this medication.

## 2022-11-21 NOTE — Telephone Encounter (Signed)
Spoke to pt. Informed gabapentin was okay to take

## 2022-11-23 ENCOUNTER — Telehealth: Payer: Self-pay

## 2022-11-23 NOTE — Telephone Encounter (Signed)
Prescription Request  11/23/2022  LOV: Visit date not found  What is the name of the medication or equipment? losartan-hydrochlorothiazide (HYZAAR) 50-12.5 MG tablet  Have you contacted your pharmacy to request a refill? No   Which pharmacy would you like this sent to?  [facility] [address] ([address]), [place] - [address] [place]) [place] [address] Phone: [phone] Fax: [phone]    Patient notified that their request is being sent to the clinical staff for review and that they should receive a response within 2 business days.   Please advise at Mobile [phone] (mobile)  Patient states her pharmacy sent her a text to let her know she needs to refill this medication.  Patient states she will be out of this medication on Thursday.  Patient states she would like to have a 90-day supply if possible.

## 2022-11-23 NOTE — Telephone Encounter (Signed)
Called pharmacy and they stated that pt has refill on file and will refill for her. Pt notified. Pt wanted to know why provider stop sending in a 90 day supply for her? Please advise and call back.

## 2022-11-23 NOTE — Telephone Encounter (Signed)
Error

## 2022-11-24 NOTE — Telephone Encounter (Signed)
Pt prefers to received medication in 90 supply instead of 30 day supply.

## 2022-11-25 ENCOUNTER — Other Ambulatory Visit: Payer: Self-pay | Admitting: Family

## 2022-11-25 DIAGNOSIS — I10 Essential (primary) hypertension: Secondary | ICD-10-CM

## 2022-11-25 MED ORDER — LOSARTAN POTASSIUM-HCTZ 50-12.5 MG PO TABS: 1.0000 | ORAL_TABLET | Freq: Every day | ORAL | 1 refills | Status: DC

## 2023-01-01 ENCOUNTER — Telehealth: Payer: Self-pay | Admitting: Nurse Practitioner

## 2023-01-01 NOTE — Telephone Encounter (Signed)
Prescription Request  01/01/2023  LOV: 09/26/2022  What is the name of the medication or equipment? Vitamin D, Ergocalciferol, (DRISDOL) 1.25 MG (50000 UNIT) CAPS capsule  Have you contacted your pharmacy to request a refill? Yes   Which pharmacy would you like this sent to?  [facility] [address] ([address]), [place] - [address] [place]) [place] [address] Phone: [phone] Fax: [phone]    Patient notified that their request is being sent to the clinical staff for review and that they should receive a response within 2 business days.   Please advise at [facility] [phone]

## 2023-01-02 NOTE — Telephone Encounter (Signed)
Pt can take OTC vit D supplement 2,000 international unit daily.

## 2023-01-04 ENCOUNTER — Telehealth: Payer: Self-pay | Admitting: Nurse Practitioner

## 2023-01-04 DIAGNOSIS — E559 Vitamin D deficiency, unspecified: Secondary | ICD-10-CM

## 2023-01-04 NOTE — Telephone Encounter (Signed)
Patient called office back, note was read. She does not want to take over the counter vit D. She wants to stay on what she is currently taking.

## 2023-01-04 NOTE — Telephone Encounter (Signed)
Attempted to call [name] answer or voicemail 

## 2023-01-04 NOTE — Telephone Encounter (Signed)
I have placed the lab order for Vit D lab. We need to check the level before we can start on prescription Vit D. Please schedule lab appointment.

## 2023-01-05 ENCOUNTER — Telehealth: Payer: Self-pay

## 2023-01-05 NOTE — Telephone Encounter (Signed)
Copied from CRM [id number]. Topic: General - Other >> Jan 05, 2023  9:46 AM Florestine Avers wrote: Reason for CRM: Patient was returning missed call. She asked that she is called back after 12:30 being that she is at work right now and can't answer her phone.

## 2023-01-05 NOTE — Telephone Encounter (Signed)
Spoke to pt. Pt stated that due to her work schedule she is unable to come in for a lab appt until she's back to her regular work schedule. Advise pt that we can't send prescription Vit D til labs have been completed. Pt verbalized understanding.

## 2023-01-05 NOTE — Telephone Encounter (Signed)
Noted! Thank you

## 2023-01-05 NOTE — Telephone Encounter (Signed)
See other telephone encounter.

## 2023-01-05 NOTE — Telephone Encounter (Signed)
Called pt to set up appt. Unable to leave vm due to pt mailbox being full

## 2023-03-07 ENCOUNTER — Ambulatory Visit (INDEPENDENT_AMBULATORY_CARE_PROVIDER_SITE_OTHER): Payer: Medicare Other | Admitting: *Deleted

## 2023-03-07 VITALS — Ht 58.5 in | Wt 160.0 lb

## 2023-03-07 DIAGNOSIS — Z0000 Encounter for general adult medical examination without abnormal findings: Secondary | ICD-10-CM | POA: Diagnosis not present

## 2023-03-07 NOTE — Patient Instructions (Addendum)
Ms. Stephanie Marsh , Thank you for taking time to come for your Medicare Wellness Visit. I appreciate your ongoing commitment to your health goals. Please review the following plan we discussed and let me know if I can assist you in the future.   Referrals/Orders/Follow-Ups/Clinician Recommendations: Please consider updating your vaccines and have test that are recommended.  This is a list of the screening recommended for you and due dates:  Health Maintenance  Topic Date Due   Hepatitis C Screening  Never done   Colon Cancer Screening  Never done   Zoster (Shingles) Vaccine (1 of 2) Never done   Pneumonia Vaccine (1 of 1 - PCV) Never done   DEXA scan (bone density measurement)  Never done   COVID-19 Vaccine (1 - 2024-25 season) Never done   Flu Shot  04/16/2023*   Medicare Annual Wellness Visit  03/06/2024   Mammogram  03/26/2024   DTaP/Tdap/Td vaccine (2 - Td or Tdap) 01/16/2026   HPV Vaccine  Aged Out  *Topic was postponed. The date shown is not the original due date.    Advanced directives: (Declined) Advance directive discussed with you today. Even though you declined this today, please call our office should you change your mind, and we can give you the proper paperwork for you to fill out.  Next Medicare Annual Wellness Visit scheduled for next year: Yes 03/11/24 @ 1:00   Ms. Stephanie Marsh , Thank you for taking time to come for your Medicare Wellness Visit. I appreciate your ongoing commitment to your health goals. Please review the following plan we discussed and let me know if I can assist you in the future.   Referrals/Orders/Follow-Ups/Clinician Recommendations: You should consider updating your vaccines. Please call  and schedule an eye exam.  This is a list of the screening recommended for you and due dates:  Health Maintenance  Topic Date Due   Hepatitis C Screening  Never done   Colon Cancer Screening  Never done   Zoster (Shingles) Vaccine (1 of 2) Never done   Pneumonia Vaccine (1  of 1 - PCV) Never done   DEXA scan (bone density measurement)  Never done   COVID-19 Vaccine (1 - 2024-25 season) Never done   Flu Shot  04/16/2023*   Medicare Annual Wellness Visit  03/06/2024   Mammogram  03/26/2024   DTaP/Tdap/Td vaccine (2 - Td or Tdap) 01/16/2026   HPV Vaccine  Aged Out  *Topic was postponed. The date shown is not the original due date.    Advanced directives: (Declined) Advance directive discussed with you today. Even though you declined this today, please call our office should you change your mind, and we can give you the proper paperwork for you to fill out.  Next Medicare Annual Wellness Visit scheduled for next year: Yes 03/11/24 @ 1:00

## 2023-03-07 NOTE — Progress Notes (Signed)
Subjective:   Stephanie Marsh is a 68 y.o. female who presents for an Initial Medicare Annual Wellness Visit.  Visit Complete: Virtual I connected with  Stephanie Marsh on 03/07/23 by a audio enabled telemedicine application and verified that I am speaking with the correct person using two identifiers.This patient declined Interactive audio and Acupuncturist. Therefore the visit was completed with audio only.   Patient Location: Home  Provider Location: Home Office  I discussed the limitations of evaluation and management by telemedicine. The patient expressed understanding and agreed to proceed.  Vital Signs: Because this visit was a virtual/telehealth visit, some criteria may be missing or patient reported. Any vitals not documented were not able to be obtained and vitals that have been documented are patient reported.  Cardiac Risk Factors include: advanced age (>15men, >54 women);hypertension;obesity (BMI >30kg/m2)     Objective:    Today's Vitals   03/07/23 0806  Weight: 160 lb (72.6 kg)  Height: 4' 10.5" (1.486 m)   Body mass index is 32.87 kg/m.     03/07/2023    8:15 AM 08/17/2022    6:59 PM 09/20/2021   12:20 AM 05/02/2021    7:53 AM 04/17/2021    2:57 PM 02/08/2019    5:34 PM  Advanced Directives  Does Patient Have a Medical Advance Directive? No No No No No No  Would patient like information on creating a medical advance directive? No - Patient declined   No - Patient declined No - Patient declined     Current Medications (verified) Outpatient Encounter Medications as of 03/07/2023  Medication Sig   losartan-hydrochlorothiazide (HYZAAR) 50-12.5 MG tablet Take 1 tablet by mouth daily.   ferrous sulfate 325 (65 FE) MG tablet Take 1 tablet (325 mg total) by mouth daily with breakfast. (Patient not taking: Reported on 03/07/2023)   Vitamin D, Ergocalciferol, (DRISDOL) 1.25 MG (50000 UNIT) CAPS capsule Take 1 capsule (50,000 Units total) by mouth every 7 (seven)  days. (Patient not taking: Reported on 03/07/2023)   No facility-administered encounter medications on file as of 03/07/2023.    Allergies (verified) Patient has no known allergies.   History: Past Medical History:  Diagnosis Date   Hypertension    Past Surgical History:  Procedure Laterality Date   NO PAST SURGERIES     Family History  Problem Relation Age of Onset   Diabetes Mother    Hypertension Father    Social History   Socioeconomic History   Marital status: Married    Spouse name: Not on file   Number of children: Not on file   Years of education: Not on file   Highest education level: Not on file  Occupational History   Not on file  Tobacco Use   Smoking status: Former    Current packs/day: 0.00    Types: Cigarettes    Quit date: 41    Years since quitting: 31.1   Smokeless tobacco: Never  Vaping Use   Vaping status: Never Used  Substance and Sexual Activity   Alcohol use: Never   Drug use: Never   Sexual activity: Yes  Other Topics Concern   Not on file  Social History Narrative   Not on file   Social Drivers of Health   Financial Resource Strain: Low Risk  (03/07/2023)   Overall Financial Resource Strain (CARDIA)    Difficulty of Paying Living Expenses: Not hard at all  Food Insecurity: No Food Insecurity (03/07/2023)  Hunger Vital Sign    Worried About Running Out of Food in the Last Year: Never true    Ran Out of Food in the Last Year: Never true  Transportation Needs: No Transportation Needs (03/07/2023)   PRAPARE - Administrator, Civil Service (Medical): No    Lack of Transportation (Non-Medical): No  Physical Activity: Inactive (03/07/2023)   Exercise Vital Sign    Days of Exercise per Week: 0 days    Minutes of Exercise per Session: 0 min  Stress: No Stress Concern Present (03/07/2023)   [employer] of Occupational Health - Occupational Stress Questionnaire    Feeling of Stress : Not at all  Social Connections:  Moderately Isolated (03/07/2023)   Social Connection and Isolation Panel [NHANES]    Frequency of Communication with Friends and Family: Once a week    Frequency of Social Gatherings with Friends and Family: More than three times a week    Attends Religious Services: Never    Database administrator or Organizations: No    Attends Engineer, structural: Never    Marital Status: Married    Tobacco Counseling Counseling given: Not Answered   Clinical Intake:  Pre-visit preparation completed: Yes  Pain : No/denies pain     BMI - recorded: 32.87 Nutritional Status: BMI > 30  Obese Nutritional Risks: None Diabetes: No  How often do you need to have someone help you when you read instructions, pamphlets, or other written materials from your doctor or pharmacy?: 1 - Never  Interpreter Needed?: No  Information entered by :: R. Yosselyn Tax LPN   Activities of Daily Living    03/07/2023    8:08 AM  In your present state of health, do you have any difficulty performing the following activities:  Hearing? 0  Vision? 0  Difficulty concentrating or making decisions? 0  Walking or climbing stairs? 0  Dressing or bathing? 0  Doing errands, shopping? 0  Preparing Food and eating ? N  Using the Toilet? N  In the past six months, have you accidently leaked urine? N  Do you have problems with loss of bowel control? N  Managing your Medications? N  Managing your Finances? N  Housekeeping or managing your Housekeeping? N    Patient Care Team: Kara Dies, NP as PCP - General (Nurse Practitioner)  Indicate any recent Medical Services you may have received from other than [facility] providers in the past year (date may be approximate).     Assessment:   This is a routine wellness examination for [facility].  Hearing/Vision screen Hearing Screening - Comments:: No issues Vision Screening - Comments:: readers   Goals Addressed             This Visit's Progress    Patient  Stated       Wants to continue to work and stay active       Depression Screen    03/07/2023    8:11 AM 09/26/2022    1:13 PM 02/16/2022    1:17 PM 06/16/2021    2:16 PM  PHQ 2/9 Scores  PHQ - 2 Score 0 0 0 0  PHQ- 9 Score 0 0      Fall Risk    03/07/2023    8:09 AM 09/26/2022    1:13 PM 02/16/2022    1:17 PM 06/16/2021    2:16 PM  Fall Risk   Falls in the past year? 0 0 0 0  Number falls  in past yr: 0 0 0 0  Injury with Fall? 0 0 0 0  Risk for fall due to : No Fall Risks No Fall Risks No Fall Risks No Fall Risks  Follow up Falls prevention discussed;Falls evaluation completed Falls evaluation completed Falls evaluation completed Falls evaluation completed    MEDICARE RISK AT HOME: Medicare Risk at Home Any stairs in or around the home?: No If so, are there any without handrails?: No Home free of loose throw rugs in walkways, pet beds, electrical cords, etc?: Yes Adequate lighting in your home to reduce risk of falls?: Yes Life alert?: No Use of a cane, walker or w/c?: No Grab bars in the bathroom?: No Shower chair or bench in shower?: No Elevated toilet seat or a handicapped toilet?: Yes  Cognitive Function:        03/07/2023    8:15 AM  6CIT Screen  What Year? 0 points  What month? 0 points  What time? 0 points  Count back from 20 0 points  Months in reverse 2 points  Repeat phrase 2 points  Total Score 4 points    Immunizations Immunization History  Administered Date(s) Administered   Tdap 01/17/2016    TDAP status: Up to date  Flu Vaccine status: Declined, Education has been provided regarding the importance of this vaccine but patient still declined. Advised may receive this vaccine at local pharmacy or Health Dept. Aware to provide a copy of the vaccination record if obtained from local pharmacy or Health Dept. Verbalized acceptance and understanding.  Pneumococcal vaccine status: Declined,  Education has been provided regarding the importance of this  vaccine but patient still declined. Advised may receive this vaccine at local pharmacy or Health Dept. Aware to provide a copy of the vaccination record if obtained from local pharmacy or Health Dept. Verbalized acceptance and understanding.   Covid-19 vaccine status: Information provided on how to obtain vaccines.   Qualifies for Shingles Vaccine? Yes   Zostavax completed No   Shingrix Completed?: No.    Education has been provided regarding the importance of this vaccine. Patient has been advised to call insurance company to determine out of pocket expense if they have not yet received this vaccine. Advised may also receive vaccine at local pharmacy or Health Dept. Verbalized acceptance and understanding.  Screening Tests Health Maintenance  Topic Date Due   Medicare Annual Wellness (AWV)  Never done   Hepatitis C Screening  Never done   Colonoscopy  Never done   Zoster Vaccines- Shingrix (1 of 2) Never done   Pneumonia Vaccine 59+ Years old (1 of 1 - PCV) Never done   DEXA SCAN  Never done   COVID-19 Vaccine (1 - 2024-25 season) Never done   INFLUENZA VACCINE  04/16/2023 (Originally 08/17/2022)   MAMMOGRAM  03/26/2024   DTaP/Tdap/Td (2 - Td or Tdap) 01/16/2026   HPV VACCINES  Aged Out    Health Maintenance  Health Maintenance Due  Topic Date Due   Medicare Annual Wellness (AWV)  Never done   Hepatitis C Screening  Never done   Colonoscopy  Never done   Zoster Vaccines- Shingrix (1 of 2) Never done   Pneumonia Vaccine 57+ Years old (1 of 1 - PCV) Never done   DEXA SCAN  Never done   COVID-19 Vaccine (1 - 2024-25 season) Never done   Colorectal cancer screening Patient declines at this time   Mammogram Status completed 03/2022 repeat every 2 years  Bone Density Status  patient declines at this time  Lung Cancer Screening: (Low Dose CT Chest recommended if Age 19-80 years, 20 pack-year currently smoking OR have quit w/in 15years.) does not qualify.     Additional  Screening:  Hepatitis C Screening: does qualify; Completed Patient declines  Vision Screening: Recommended annual ophthalmology exams for early detection of glaucoma and other disorders of the eye. Is the patient up to date with their annual eye exam?  Yes  Who is the provider or what is the name of the office in which the patient attends annual eye exams? [facility]  Patient was advised that she does need to call and schedule an eye exam If pt is not established with a provider, would they like to be referred to a provider to establish care? No .   Dental Screening: Recommended annual dental exams for proper oral hygiene    Community Resource Referral / Chronic Care Management: CRR required this visit?  No   CCM required this visit?  No     Plan:     I have personally reviewed and noted the following in the patient's chart:   Medical and social history Use of alcohol, tobacco or illicit drugs  Current medications and supplements including opioid prescriptions. Patient is not currently taking opioid prescriptions. Functional ability and status Nutritional status Physical activity Advanced directives List of other physicians Hospitalizations, surgeries, and ER visits in previous 12 months Vitals Screenings to include cognitive, depression, and falls Referrals and appointments  In addition, I have reviewed and discussed with patient certain preventive protocols, quality metrics, and best practice recommendations. A written personalized care plan for preventive services as well as general preventive health recommendations were provided to patient.     Sydell Axon, LPN   1/61/0960   After Visit Summary: (Pick Up) Due to this being a telephonic visit, with patients personalized plan was offered to patient and patient has requested to Pick up at office.  Nurse Notes: see routing comments

## 2023-03-27 ENCOUNTER — Ambulatory Visit: Payer: Medicare Other | Admitting: Nurse Practitioner

## 2023-06-11 ENCOUNTER — Other Ambulatory Visit: Payer: Self-pay | Admitting: Family

## 2023-06-11 DIAGNOSIS — I10 Essential (primary) hypertension: Secondary | ICD-10-CM

## 2023-06-12 ENCOUNTER — Encounter: Payer: Self-pay | Admitting: Pharmacist

## 2023-06-12 NOTE — Progress Notes (Signed)
 Attempted to call Patient and schedule medication refill appointment but her phone stated the call can not be completed at this time.

## 2023-06-12 NOTE — Progress Notes (Signed)
 Pharmacy Quality Measure Review  This patient is appearing on a report for being at risk of failing the adherence measure for hypertension (ACEi/ARB) medications this calendar year.   Medication: Losartan -hydrochlorothiazide  50-12.5 Last fill date: 05/14/2023 for 30 day supply  0 refills remaining on current prescription. No PCP follow up scheduled at this time.  Will collaborate with provider to facilitate refill needs. PCP pool messaged for medication refill of chronic medication. Consider 90 ds for adherence.   Future Appointments  Date Time Provider Department Center  03/11/2024  1:00 PM [facility] ANNUAL WELLNESS VISIT [facility]

## 2023-06-20 ENCOUNTER — Other Ambulatory Visit: Payer: Self-pay | Admitting: Nurse Practitioner

## 2023-06-20 ENCOUNTER — Telehealth: Payer: Self-pay | Admitting: Nurse Practitioner

## 2023-06-20 DIAGNOSIS — I10 Essential (primary) hypertension: Secondary | ICD-10-CM

## 2023-06-20 NOTE — Telephone Encounter (Signed)
 Last Fill: Hyzaar: 11/25/22     Meclizine : 02/16/22 not on current med list  Last OV: 09/26/22 Next OV: 03/11/24 AWV  Routing to provider for review/authorization.

## 2023-06-20 NOTE — Telephone Encounter (Signed)
 Pt requested medication not on med list: Medication: meclizine  (ANTIVERT ) 12.5 MG tablet

## 2023-06-20 NOTE — Telephone Encounter (Unsigned)
 Copied from CRM [id number]. Topic: Clinical - Medication Refill >> Jun 20, 2023  1:30 PM Loetta Ringer H wrote: Medication: losartan -hydrochlorothiazide  (HYZAAR) 50-12.5 MG tablet  Meclizine  12.5 mg(for vertigo)  Has the patient contacted their pharmacy? No (Agent: If no, request that the patient contact the pharmacy for the refill. If patient does not wish to contact the pharmacy document the reason why and proceed with request.) (Agent: If yes, when and what did the pharmacy advise?)  This is the patient's preferred pharmacy:  [facility] [address] ([address]), [place] - [address] [place]) [place] [address] Phone: [phone] Fax: [phone]  Is this the correct pharmacy for this prescription? Yes If no, delete pharmacy and type the correct one.   Has the prescription been filled recently? No  Is the patient out of the medication? Yes  Has the patient been seen for an appointment in the last year OR does the patient have an upcoming appointment? Yes  Can we respond through MyChart? No  Agent: Please be advised that Rx refills may take up to 3 business days. We ask that you follow-up with your pharmacy.

## 2023-06-20 NOTE — Telephone Encounter (Unsigned)
 Copied from CRM [id number]. Topic: Clinical - Medication Refill >> Jun 20, 2023  1:38 PM Abigail D wrote: Medication: meclizine  (ANTIVERT ) 12.5 MG tablet   Has the patient contacted their pharmacy? No, discontinued - patient requesting it to be renewed.  (Agent: If no, request that the patient contact the pharmacy for the refill. If patient does not wish to contact the pharmacy document the reason why and proceed with request.) (Agent: If yes, when and what did the pharmacy advise?)  This is the patient's preferred pharmacy:  [facility] [address]), [place] - [address] [place]) [place] [address] Phone: [phone] Fax: [phone]  Is this the correct pharmacy for this prescription? Yes If no, delete pharmacy and type the correct one.   Has the prescription been filled recently? No  Is the patient out of the medication? Yes  Has the patient been seen for an appointment in the last year OR does the patient have an upcoming appointment? Yes  Can we respond through MyChart? No  Agent: Please be advised that Rx refills may take up to 3 business days. We ask that you follow-up with your pharmacy.

## 2023-06-21 MED ORDER — LOSARTAN POTASSIUM-HCTZ 50-12.5 MG PO TABS: 1.0000 | ORAL_TABLET | Freq: Every day | ORAL | 1 refills | Status: DC

## 2023-06-21 NOTE — Progress Notes (Signed)
 Called Patient she states she is completely out of her dizziness medication and she has 5 losartan  hydrochlorothiazide  pills left. Patient has appointment scheduled for 07/12/23.

## 2023-06-21 NOTE — Telephone Encounter (Signed)
 Patient states the Meclizine  is for when she goes on cruises and her vertigo.

## 2023-06-21 NOTE — Progress Notes (Signed)
 See previous message

## 2023-06-25 ENCOUNTER — Telehealth: Payer: Self-pay | Admitting: Nurse Practitioner

## 2023-06-25 NOTE — Telephone Encounter (Signed)
 Not on current med list  Last Fill: 02/16/22  Last OV: 03/07/23 Next OV: 07/12/23  Routing to provider for review/authorization.        Copied from CRM [id number]. Topic: Clinical - Medication Refill >> Jun 20, 2023  1:38 PM Abigail D wrote: Medication: meclizine  (ANTIVERT ) 12.5 MG tablet   Has the patient contacted their pharmacy? No, discontinued - patient requesting it to be renewed.  (Agent: If no, request that the patient contact the pharmacy for the refill. If patient does not wish to contact the pharmacy document the reason why and proceed with request.) (Agent: If yes, when and what did the pharmacy advise?)  This is the patient's preferred pharmacy:  [facility] [address] ([address]), [place] - [address] [place]) [place] [address] Phone: [phone] Fax: [phone]  Is this the correct pharmacy for this prescription? Yes If no, delete pharmacy and type the correct one.   Has the prescription been filled recently? No  Is the patient out of the medication? Yes  Has the patient been seen for an appointment in the last year OR does the patient have an upcoming appointment? Yes  Can we respond through MyChart? No  Agent: Please be advised that Rx refills may take up to 3 business days. We ask that you follow-up with your pharmacy. >> Jun 25, 2023 11:13 AM Vivian Z wrote: Patient calling to check on status of refill because she is out of this medication.

## 2023-06-26 ENCOUNTER — Other Ambulatory Visit: Payer: Self-pay | Admitting: Nurse Practitioner

## 2023-06-26 ENCOUNTER — Telehealth: Payer: Self-pay

## 2023-06-26 NOTE — Telephone Encounter (Signed)
 Copied from CRM [id number]. Topic: Clinical - Prescription Issue >> Jun 26, 2023 11:42 AM Chuck Crater wrote: Reason for CRM: Patient stated that she is completely out of meclizine  (ANTIVERT ) 12.5 MG tablet and checking the status of refill. She is going on a cruise at the end of the month.

## 2023-06-26 NOTE — Telephone Encounter (Signed)
 Please inform patient. The medication has been refilled.

## 2023-06-26 NOTE — Telephone Encounter (Signed)
Patient is aware that medication has been sent in 

## 2023-07-12 ENCOUNTER — Ambulatory Visit (INDEPENDENT_AMBULATORY_CARE_PROVIDER_SITE_OTHER): Admitting: Nurse Practitioner

## 2023-07-12 ENCOUNTER — Encounter: Payer: Self-pay | Admitting: Nurse Practitioner

## 2023-07-12 VITALS — BP 128/80 | HR 64 | Temp 97.8°F | Ht 58.5 in | Wt 149.2 lb

## 2023-07-12 DIAGNOSIS — Z131 Encounter for screening for diabetes mellitus: Secondary | ICD-10-CM | POA: Diagnosis not present

## 2023-07-12 DIAGNOSIS — G8929 Other chronic pain: Secondary | ICD-10-CM | POA: Diagnosis not present

## 2023-07-12 DIAGNOSIS — E559 Vitamin D deficiency, unspecified: Secondary | ICD-10-CM | POA: Diagnosis not present

## 2023-07-12 DIAGNOSIS — I10 Essential (primary) hypertension: Secondary | ICD-10-CM | POA: Diagnosis not present

## 2023-07-12 DIAGNOSIS — Z1211 Encounter for screening for malignant neoplasm of colon: Secondary | ICD-10-CM

## 2023-07-12 DIAGNOSIS — M545 Low back pain, unspecified: Secondary | ICD-10-CM

## 2023-07-12 LAB — LIPID PANEL
Cholesterol: 185 mg/dL (ref 0–200)
HDL: 47.6 mg/dL (ref >39.00)
LDL Cholesterol: 116 mg/dL — ABNORMAL HIGH (ref 0–99)
NonHDL: 137.13
Total CHOL/HDL Ratio: 4
Triglycerides: 107 mg/dL (ref 0.0–149.0)
VLDL: 21.4 mg/dL (ref 0.0–40.0)

## 2023-07-12 LAB — CBC WITH DIFFERENTIAL/PLATELET
Basophils Absolute: 0 10*3/uL (ref 0.0–0.1)
Basophils Relative: 0.4 % (ref 0.0–3.0)
Eosinophils Absolute: 0.1 10*3/uL (ref 0.0–0.7)
Eosinophils Relative: 1.1 % (ref 0.0–5.0)
HCT: 35 % — ABNORMAL LOW (ref 36.0–46.0)
Hemoglobin: 11.4 g/dL — ABNORMAL LOW (ref 12.0–15.0)
Lymphocytes Relative: 36.6 % (ref 12.0–46.0)
Lymphs Abs: 2.4 10*3/uL (ref 0.7–4.0)
MCHC: 32.5 g/dL (ref 30.0–36.0)
MCV: 85.4 fl (ref 78.0–100.0)
Monocytes Absolute: 0.5 10*3/uL (ref 0.1–1.0)
Monocytes Relative: 7.2 % (ref 3.0–12.0)
Neutro Abs: 3.5 10*3/uL (ref 1.4–7.7)
Neutrophils Relative %: 54.7 % (ref 43.0–77.0)
Platelets: 243 10*3/uL (ref 150.0–400.0)
RBC: 4.1 Mil/uL (ref 3.87–5.11)
RDW: 13.6 % (ref 11.5–15.5)
WBC: 6.4 10*3/uL (ref 4.0–10.5)

## 2023-07-12 LAB — COMPREHENSIVE METABOLIC PANEL WITH GFR
ALT: 12 U/L (ref 0–35)
AST: 14 U/L (ref 0–37)
Albumin: 4.1 g/dL (ref 3.5–5.2)
Alkaline Phosphatase: 54 U/L (ref 39–117)
BUN: 18 mg/dL (ref 6–23)
CO2: 29 meq/L (ref 19–32)
Calcium: 9.4 mg/dL (ref 8.4–10.5)
Chloride: 104 meq/L (ref 96–112)
Creatinine, Ser: 0.76 mg/dL (ref 0.40–1.20)
GFR: 80.1 mL/min (ref >60.00)
Glucose, Bld: 93 mg/dL (ref 70–99)
Potassium: 3.7 meq/L (ref 3.5–5.1)
Sodium: 139 meq/L (ref 135–145)
Total Bilirubin: 0.3 mg/dL (ref 0.2–1.2)
Total Protein: 7.1 g/dL (ref 6.0–8.3)

## 2023-07-12 LAB — TSH: TSH: 1.07 u[IU]/mL (ref 0.35–5.50)

## 2023-07-12 LAB — HEMOGLOBIN A1C: Hgb A1c MFr Bld: 5.8 % (ref 4.6–6.5)

## 2023-07-12 LAB — VITAMIN D 25 HYDROXY (VIT D DEFICIENCY, FRACTURES): VITD: 9.39 ng/mL — ABNORMAL LOW (ref 30.00–100.00)

## 2023-07-12 NOTE — Progress Notes (Unsigned)
 Established Patient Office Visit  Subjective:  Patient ID: Stephanie Marsh, female    DOB: [birth year]  Age: 68 y.o. MRN: [id number]  CC:  Chief Complaint  Patient presents with  . Medical Management of Chronic Issues    HPI  Stephanie Marsh presents for chronic disease follow up.   HPI   Past Medical History:  Diagnosis Date  . Anemia   . Back pain   . Hypertension   . Vitamin D  deficiency disease     Past Surgical History:  Procedure Laterality Date  . NO PAST SURGERIES      Family History  Problem Relation Age of Onset  . Diabetes Mother   . Hypertension Father     Social History   Socioeconomic History  . Marital status: Married    Spouse name: Not on file  . Number of children: Not on file  . Years of education: Not on file  . Highest education level: Not on file  Occupational History  . Not on file  Tobacco Use  . Smoking status: Former    Current packs/day: 0.00    Types: Cigarettes    Quit date: 1994    Years since quitting: 31.5  . Smokeless tobacco: Never  Vaping Use  . Vaping status: Never Used  Substance and Sexual Activity  . Alcohol use: Never  . Drug use: Never  . Sexual activity: Yes  Other Topics Concern  . Not on file  Social History Narrative  . Not on file   Social Drivers of Health   Financial Resource Strain: Low Risk  (03/07/2023)   Overall Financial Resource Strain (CARDIA)   . Difficulty of Paying Living Expenses: Not hard at all  Food Insecurity: No Food Insecurity (03/07/2023)   Hunger Vital Sign   . Worried About Programme researcher, broadcasting/film/video in the Last Year: Never true   . Ran Out of Food in the Last Year: Never true  Transportation Needs: No Transportation Needs (03/07/2023)   PRAPARE - Transportation   . Lack of Transportation (Medical): No   . Lack of Transportation (Non-Medical): No  Physical Activity: Inactive (03/07/2023)   Exercise Vital Sign   . Days of Exercise per Week: 0 days   . Minutes of Exercise per Session: 0  min  Stress: No Stress Concern Present (03/07/2023)   [employer] of Occupational Health - Occupational Stress Questionnaire   . Feeling of Stress : Not at all  Social Connections: Moderately Isolated (03/07/2023)   Social Connection and Isolation Panel   . Frequency of Communication with Friends and Family: Once a week   . Frequency of Social Gatherings with Friends and Family: More than three times a week   . Attends Religious Services: Never   . Active Member of Clubs or Organizations: No   . Attends Banker Meetings: Never   . Marital Status: Married  Catering manager Violence: Not At Risk (03/07/2023)   Humiliation, Afraid, Rape, and Kick questionnaire   . Fear of Current or Ex-Partner: No   . Emotionally Abused: No   . Physically Abused: No   . Sexually Abused: No     Outpatient Medications Prior to Visit  Medication Sig Dispense Refill  . losartan -hydrochlorothiazide  (HYZAAR) 50-12.5 MG tablet Take 1 tablet by mouth daily. 90 tablet 1  . meclizine  (ANTIVERT ) 12.5 MG tablet TAKE 1 TABLET BY MOUTH THREE TIMES DAILY AS NEEDED FOR DIZZINESS 30 tablet 0  . ferrous sulfate  325 (65 FE)  MG tablet Take 1 tablet (325 mg total) by mouth daily with breakfast. (Patient not taking: Reported on 03/07/2023) 30 tablet 3  . Vitamin D , Ergocalciferol , (DRISDOL ) 1.25 MG (50000 UNIT) CAPS capsule Take 1 capsule (50,000 Units total) by mouth every 7 (seven) days. (Patient not taking: Reported on 03/07/2023) 12 capsule 0   No facility-administered medications prior to visit.    No Known Allergies  ROS Review of Systems Negative unless indicated in HPI.    Objective:    Physical Exam  BP 128/80   Pulse 64   Temp 97.8 F (36.6 C)   Ht 4' 10.5 (1.486 m)   Wt 149 lb 3.2 oz (67.7 kg)   SpO2 96%   BMI 30.65 kg/m  Wt Readings from Last 3 Encounters:  07/12/23 149 lb 3.2 oz (67.7 kg)  03/07/23 160 lb (72.6 kg)  09/26/22 157 lb (71.2 kg)     Health Maintenance  Topic  Date Due  . Hepatitis C Screening  Never done  . Colonoscopy  Never done  . Pneumococcal Vaccine: 50+ Years (1 of 1 - PCV) Never done  . Zoster Vaccines- Shingrix (1 of 2) Never done  . DEXA SCAN  Never done  . COVID-19 Vaccine (4 - 2024-25 season) 09/17/2022  . INFLUENZA VACCINE  08/17/2023  . Medicare Annual Wellness (AWV)  03/06/2024  . MAMMOGRAM  03/26/2024  . DTaP/Tdap/Td (2 - Td or Tdap) 01/16/2026  . Hepatitis B Vaccines  Aged Out  . HPV VACCINES  Aged Out  . Meningococcal B Vaccine  Aged Out    There are no preventive care reminders to display for this patient.  Lab Results  Component Value Date   TSH 1.07 07/12/2023   Lab Results  Component Value Date   WBC 6.4 07/12/2023   HGB 11.4 (L) 07/12/2023   HCT 35.0 (L) 07/12/2023   MCV 85.4 07/12/2023   PLT 243.0 07/12/2023   Lab Results  Component Value Date   NA 139 07/12/2023   K 3.7 07/12/2023   CO2 29 07/12/2023   GLUCOSE 93 07/12/2023   BUN 18 07/12/2023   CREATININE 0.76 07/12/2023   BILITOT 0.3 07/12/2023   ALKPHOS 54 07/12/2023   AST 14 07/12/2023   ALT 12 07/12/2023   PROT 7.1 07/12/2023   ALBUMIN 4.1 07/12/2023   CALCIUM 9.4 07/12/2023   EGFR 96 06/01/2021   GFR 80.10 07/12/2023   Lab Results  Component Value Date   CHOL 185 07/12/2023   Lab Results  Component Value Date   HDL 47.60 07/12/2023   Lab Results  Component Value Date   LDLCALC 116 (H) 07/12/2023   Lab Results  Component Value Date   TRIG 107.0 07/12/2023   Lab Results  Component Value Date   CHOLHDL 4 07/12/2023   Lab Results  Component Value Date   HGBA1C 5.8 07/12/2023      Assessment & Plan:  Encounter for screening colonoscopy -     Cologuard  Vitamin D  deficiency -     VITAMIN D  25 Hydroxy (Vit-D Deficiency, Fractures)  Hypertension, unspecified type -     CBC with Differential/Platelet -     Comprehensive metabolic panel with GFR -     Lipid panel -     TSH  Encounter for screening for diabetes  mellitus -     Hemoglobin A1c    Follow-up: Return in about 6 months (around 01/11/2024).   Selicia Windom, NP

## 2023-07-13 ENCOUNTER — Ambulatory Visit: Payer: Self-pay | Admitting: Nurse Practitioner

## 2023-07-13 ENCOUNTER — Telehealth: Payer: Self-pay | Admitting: Nurse Practitioner

## 2023-07-13 DIAGNOSIS — E559 Vitamin D deficiency, unspecified: Secondary | ICD-10-CM

## 2023-07-13 MED ORDER — VITAMIN D (ERGOCALCIFEROL) 1.25 MG (50000 UNIT) PO CAPS: 50000.0000 [IU] | ORAL_CAPSULE | ORAL | 0 refills | Status: DC

## 2023-07-13 NOTE — Telephone Encounter (Unsigned)
 Copied from CRM [id number]. Topic: Clinical - Medication Question >> Jul 13, 2023  1:34 PM Stephanie Marsh wrote: Reason for CRM: Pt picked up vitamin D  but advise the iron supplement was not there need to clarify Is it a new iron supplement or the one she was prescribe a year ago, pt has to work at 3 and get off midnight, can be reached [phone]

## 2023-07-16 ENCOUNTER — Other Ambulatory Visit: Payer: Self-pay | Admitting: Nurse Practitioner

## 2023-07-16 NOTE — Telephone Encounter (Unsigned)
 Copied from CRM [id number]. Topic: Clinical - Medication Refill >> Jul 16, 2023 10:27 AM Tanazia G wrote: Medication:  ferrous sulfate  325 (65 FE) MG tablet  Has the patient contacted their pharmacy? Yes (Agent: If no, request that the patient contact the pharmacy for the refill. If patient does not wish to contact the pharmacy document the reason why and proceed with request.) (Agent: If yes, when and what did the pharmacy advise?)  This is the patient's preferred pharmacy:  [facility] [address]), [place] - [address] [place]) [place] [address] Phone: [phone] Fax: [phone]  Is this the correct pharmacy for this prescription? Yes If no, delete pharmacy and type the correct one.   Has the prescription been filled recently? Yes  Is the patient out of the medication? Yes  Has the patient been seen for an appointment in the last year OR does the patient have an upcoming appointment? Yes  Can we respond through MyChart? Yes  Agent: Please be advised that Rx refills may take up to 3 business days. We ask that you follow-up with your pharmacy.

## 2023-07-18 NOTE — Telephone Encounter (Unsigned)
 Copied from CRM [id number]. Topic: Clinical - Medication Question >> Jul 13, 2023  1:34 PM Ernestene P wrote: Reason for CRM: Pt picked up vitamin D  but advise the iron supplement was not there need to clarify Is it a new iron supplement or the one she was prescribe a year ago, pt has to work at 3 and get off midnight, can be reached [phone]

## 2023-07-18 NOTE — Telephone Encounter (Unsigned)
 Copied from CRM [id number]. Topic: Clinical - Medication Refill >> Jul 18, 2023 11:43 AM Robinson H wrote: Patient calling following up on message sent regarding iron pills, patient states she's at work and couldn't hold on but okay to leave a voicemail on number on file.

## 2023-07-23 ENCOUNTER — Encounter: Payer: Self-pay | Admitting: Nurse Practitioner

## 2023-07-26 MED ORDER — FERROUS SULFATE 325 (65 FE) MG PO TABS: 325.0000 mg | ORAL_TABLET | Freq: Every day | ORAL | 3 refills | Status: DC

## 2023-07-26 NOTE — Assessment & Plan Note (Signed)
 Chronic. Stable. -Continue losartan /HCTZ 50 -12.5 mg daily. -Will continue to monitor.

## 2023-07-26 NOTE — Assessment & Plan Note (Signed)
 Will check vitamin D level.

## 2023-07-26 NOTE — Assessment & Plan Note (Signed)
 Chronic back pain. - Advised weekend trial of gabapentin prescribed by different provider for back pain to assess tolerance and side effects. - Recommended heating pads and topical creams for relief. - Ensured proper posture when sitting.

## 2023-08-21 ENCOUNTER — Telehealth: Payer: Self-pay

## 2023-08-21 NOTE — Telephone Encounter (Signed)
 Copied from CRM [id number]. Topic: Clinical - Medication Question >> Aug 21, 2023  1:30 PM Mesmerise C wrote: Reason for CRM: Patient requesting a medication she was prescribed by back specialist doctor for gabapentin  300mg  would like to know if her doctor will prescribe it to her

## 2023-08-22 NOTE — Telephone Encounter (Signed)
 Please call pt and check if she has been taking gabapentin  300 mg for back pain and how many times a day?

## 2023-08-22 NOTE — Telephone Encounter (Signed)
 Called PT she stated she hasn't been taking Gabapentin  300 due to it being outdate last year's date. She also mention medication about iron pills needs but pharmacy hasn't filled yet.

## 2023-08-23 ENCOUNTER — Other Ambulatory Visit: Payer: Self-pay | Admitting: Nurse Practitioner

## 2023-08-23 MED ORDER — FERROUS SULFATE 325 (65 FE) MG PO TABS: 325.0000 mg | ORAL_TABLET | Freq: Every day | ORAL | 3 refills | Status: AC

## 2023-08-23 MED ORDER — GABAPENTIN 300 MG PO CAPS: 300.0000 mg | ORAL_CAPSULE | Freq: Every day | ORAL | 0 refills | Status: DC

## 2023-08-23 NOTE — Telephone Encounter (Signed)
 Please inform pt that gabapentin  and Iron supplement hs been refilled.

## 2023-08-23 NOTE — Telephone Encounter (Unsigned)
 Copied from CRM [id number]. Topic: General - Other >> Aug 23, 2023  1:33 PM Franky GRADE wrote: Reason for CRM: Patient is calling to follow up with Little, Levora regarding their conversation yesterday.

## 2023-08-24 NOTE — Telephone Encounter (Signed)
 Called Patient to let her know that Charan called in Gabapentin  and Iron for her.

## 2023-08-28 ENCOUNTER — Ambulatory Visit: Payer: Self-pay

## 2023-08-28 NOTE — Telephone Encounter (Signed)
 FYI Only or Action Required?: Action required by provider: wants to make sure it is ok to take iron 325 mg with all her other medications.  Patient was last seen in primary care on 07/12/2023 by Vincente Saber, NP.  Called Nurse Triage reporting No chief complaint on file..  Symptoms began today.  Interventions attempted: Nothing.  Symptoms are: stable.  Triage Disposition: No disposition on file.  Patient/caregiver understands and will follow disposition?:  Copied from CRM [id number]. Topic: Clinical - Medication Question >> Aug 28, 2023 10:05 AM Rea BROCKS wrote: Reason for CRM: Patient called in and stated that the iron is 325 mg and she doesn't remember them being that high in mgs and if that is safe.  She wants to know if it's okay to take with her pain pills, and blood pressure pills. She wants to know if it's okay to take them all together.   [phone] (M)

## 2023-08-29 ENCOUNTER — Telehealth: Payer: Self-pay

## 2023-08-29 NOTE — Telephone Encounter (Signed)
 error

## 2023-08-30 NOTE — Telephone Encounter (Signed)
 Called PT to relay message PT is aware

## 2023-08-30 NOTE — Telephone Encounter (Signed)
 Called pt but vm was full unable to leave a message to return call to office.  Okay to relay message. Please document when pt is spoke to.

## 2023-08-30 NOTE — Telephone Encounter (Signed)
 She can take iron supplement while she is on the BP and gabapentin , but space it 2 hours after or before and take with orange juice to increase the absorption.

## 2023-09-22 ENCOUNTER — Other Ambulatory Visit: Payer: Self-pay | Admitting: Nurse Practitioner

## 2023-10-18 ENCOUNTER — Other Ambulatory Visit: Payer: Self-pay | Admitting: Nurse Practitioner

## 2023-10-18 NOTE — Telephone Encounter (Unsigned)
 Copied from CRM #[id number]. Topic: Clinical - Medication Refill >> Oct 18, 2023  2:11 PM Drema MATSU wrote: Medication: Vitamin D , Ergocalciferol , (DRISDOL ) 1.25 MG (50000 UNIT) CAPS capsule  Has the patient contacted their pharmacy? Yes (Agent: If no, request that the patient contact the pharmacy for the refill. If patient does not wish to contact the pharmacy document the reason why and proceed with request.)Pharmacy was suppose to put request in  (Agent: If yes, when and what did the pharmacy advise?)  This is the patient's preferred pharmacy:  [facility] [address] ([address]), [place] - [address] [place]) [place] [address] Phone: [phone] Fax: [phone]  Is this the correct pharmacy for this prescription? Yes If no, delete pharmacy and type the correct one.   Has the prescription been filled recently? Yes  Is the patient out of the medication? Yes  Has the patient been seen for an appointment in the last year OR does the patient have an upcoming appointment? Yes  Can we respond through MyChart? No  Agent: Please be advised that Rx refills may take up to 3 business days. We ask that you follow-up with your pharmacy.

## 2023-10-24 ENCOUNTER — Telehealth: Payer: Self-pay

## 2023-10-24 NOTE — Telephone Encounter (Signed)
>>   Oct 24, 2023 10:10 AM Turkey A wrote: Patient refill has not been completed. Patient said she is completely out and would like a call back from Nurse.  Copied from CRM #[id number]. Topic: Clinical - Medication Refill >> Oct 18, 2023  2:11 PM Drema MATSU wrote: Medication: Vitamin D , Ergocalciferol , (DRISDOL ) 1.25 MG (50000 UNIT) CAPS capsule  Has the patient contacted their pharmacy? Yes (Agent: If no, request that the patient contact the pharmacy for the refill. If patient does not wish to contact the pharmacy document the reason why and proceed with request.)Pharmacy was suppose to put request in  (Agent: If yes, when and what did the pharmacy advise?)  This is the patient's preferred pharmacy:  [facility] [address] ([address]), [place] - [address] [place]) [place] [address] Phone: [phone] Fax: [phone]  Is this the correct pharmacy for this prescription? Yes If no, delete pharmacy and type the correct one.   Has the prescription been filled recently? Yes  Is the patient out of the medication? Yes  Has the patient been seen for an appointment in the last year OR does the patient have an upcoming appointment? Yes  Can we respond through MyChart? No  Agent: Please be advised that Rx refills may take up to 3 business days. We ask that you follow-up with your pharmacy.

## 2023-10-26 NOTE — Telephone Encounter (Signed)
Needs Vit D level checked.

## 2023-10-30 NOTE — Telephone Encounter (Signed)
 Called Patient with Stephanie Marsh's recommendations and Patient states she is on prescription vitamin D  and she is not going to switch back and forth, back and forth with these medications. I tried to explain vitamins and that you are switching due to the vitamin D  lab she had done. Patient did not want to hear that she started yelling she has already told the assistant she is not switching back and forth. I tried to schedule the office visit for 6 weeks out and she yelled like she already told the assistant she works 2 jobs 5 days a week and does not have time to come in.

## 2023-10-30 NOTE — Telephone Encounter (Signed)
 Please inform pt she can take Vit D OTC 2000 international units daily. We will recheck the  VIT D level during her next visit. Please schedule Ov in 6 weeks.

## 2023-10-30 NOTE — Telephone Encounter (Unsigned)
 Copied from CRM [id number]. Topic: Clinical - Medical Advice >> Oct 30, 2023 11:46 AM Anairis L wrote: Reason for CRM: Patient called in regarding medication refill, I informed her provide wanted to recheck her vitamin D  levels again and she informed me she has no days off during the week and cant  Comein, she will simply stop taking medication.

## 2023-10-31 NOTE — Telephone Encounter (Signed)
 Patient return call and has been made aware of most recent notations pertaining to upcoming appt that has been scheduled.

## 2023-10-31 NOTE — Telephone Encounter (Signed)
 Attempted to call the Patient no answer and voicemail is full. Please let the Patient know that Per Her Message she is scheduled for Thursday 12/27/23 at 1:00.

## 2023-10-31 NOTE — Telephone Encounter (Unsigned)
 Copied from CRM #[id number]. Topic: Appointments - Scheduling Inquiry for Clinic >> Oct 31, 2023 12:15 PM Sasha M wrote: Reason for CRM: Pt called in today to schedule follow up appt for 6 week vitamin recheck. Pt works two jobs and is very specific with her scheduling needs. She can ONLY come in on Thursday or Friday at 1 pm due to having to be at work by 2 pm. I cannot find any accommodations in scheduling and was wondering if the provider could check her schedule and call the pt to schedule her. Phone number is [phone]

## 2023-12-05 NOTE — Telephone Encounter (Signed)
 Vit d

## 2023-12-18 ENCOUNTER — Other Ambulatory Visit: Payer: Self-pay | Admitting: Nurse Practitioner

## 2023-12-18 DIAGNOSIS — I10 Essential (primary) hypertension: Secondary | ICD-10-CM

## 2023-12-18 NOTE — Telephone Encounter (Unsigned)
 Copied from CRM #[id number]. Topic: Clinical - Medication Refill >> Dec 18, 2023  9:35 AM Roselie BROCKS wrote: Medication: Blood pressure medication,but patient doesn't know name   Has the patient contacted their pharmacy? No (Agent: If no, request that the patient contact the pharmacy for the refill. If patient does not wish to contact the pharmacy document the reason why and proceed with request.) (Agent: If yes, when and what did the pharmacy advise?)  This is the patient's preferred pharmacy:  [facility] [address]), [place] - [address] [place]) [place] [address] Phone: [phone] Fax: [phone]  Is this the correct pharmacy for this prescription? Yes If no, delete pharmacy and type the correct one.   Has the prescription been filled recently? No 3 month supply   Is the patient out of the medication? No  Has the patient been seen for an appointment in the last year OR does the patient have an upcoming appointment? Yes  Can we respond through MyChart? No  Agent: Please be advised that Rx refills may take up to 3 business days. We ask that you follow-up with your pharmacy.

## 2023-12-19 MED ORDER — LOSARTAN POTASSIUM-HCTZ 50-12.5 MG PO TABS: 1.0000 | ORAL_TABLET | Freq: Every day | ORAL | 3 refills | Status: AC

## 2023-12-27 ENCOUNTER — Ambulatory Visit (INDEPENDENT_AMBULATORY_CARE_PROVIDER_SITE_OTHER): Admitting: Nurse Practitioner

## 2023-12-27 ENCOUNTER — Encounter: Payer: Self-pay | Admitting: Nurse Practitioner

## 2023-12-27 VITALS — BP 118/76 | HR 61 | Temp 98.1°F | Ht 58.5 in | Wt 150.4 lb

## 2023-12-27 DIAGNOSIS — M545 Low back pain, unspecified: Secondary | ICD-10-CM | POA: Diagnosis not present

## 2023-12-27 DIAGNOSIS — R7303 Prediabetes: Secondary | ICD-10-CM | POA: Diagnosis not present

## 2023-12-27 DIAGNOSIS — G8929 Other chronic pain: Secondary | ICD-10-CM

## 2023-12-27 DIAGNOSIS — I10 Essential (primary) hypertension: Secondary | ICD-10-CM

## 2023-12-27 DIAGNOSIS — E559 Vitamin D deficiency, unspecified: Secondary | ICD-10-CM

## 2023-12-27 DIAGNOSIS — Z6830 Body mass index (BMI) 30.0-30.9, adult: Secondary | ICD-10-CM | POA: Insufficient documentation

## 2023-12-27 LAB — COMPREHENSIVE METABOLIC PANEL WITH GFR
ALT: 16 U/L (ref 0–35)
AST: 21 U/L (ref 0–37)
Albumin: 4.2 g/dL (ref 3.5–5.2)
Alkaline Phosphatase: 53 U/L (ref 39–117)
BUN: 16 mg/dL (ref 6–23)
CO2: 31 meq/L (ref 19–32)
Calcium: 9.5 mg/dL (ref 8.4–10.5)
Chloride: 104 meq/L (ref 96–112)
Creatinine, Ser: 0.67 mg/dL (ref 0.40–1.20)
GFR: 89.05 mL/min (ref >60.00)
Glucose, Bld: 87 mg/dL (ref 70–99)
Potassium: 3.9 meq/L (ref 3.5–5.1)
Sodium: 141 meq/L (ref 135–145)
Total Bilirubin: 0.5 mg/dL (ref 0.2–1.2)
Total Protein: 6.5 g/dL (ref 6.0–8.3)

## 2023-12-27 LAB — CBC WITH DIFFERENTIAL/PLATELET
Basophils Absolute: 0 K/uL (ref 0.0–0.1)
Basophils Relative: 0.3 % (ref 0.0–3.0)
Eosinophils Absolute: 0.1 K/uL (ref 0.0–0.7)
Eosinophils Relative: 1 % (ref 0.0–5.0)
HCT: 36 % (ref 36.0–46.0)
Hemoglobin: 11.8 g/dL — ABNORMAL LOW (ref 12.0–15.0)
Lymphocytes Relative: 30.2 % (ref 12.0–46.0)
Lymphs Abs: 2 K/uL (ref 0.7–4.0)
MCHC: 32.7 g/dL (ref 30.0–36.0)
MCV: 85.9 fl (ref 78.0–100.0)
Monocytes Absolute: 0.4 K/uL (ref 0.1–1.0)
Monocytes Relative: 6.5 % (ref 3.0–12.0)
Neutro Abs: 4 K/uL (ref 1.4–7.7)
Neutrophils Relative %: 62 % (ref 43.0–77.0)
Platelets: 227 K/uL (ref 150.0–400.0)
RBC: 4.19 Mil/uL (ref 3.87–5.11)
RDW: 13.6 % (ref 11.5–15.5)
WBC: 6.5 K/uL (ref 4.0–10.5)

## 2023-12-27 LAB — VITAMIN D 25 HYDROXY (VIT D DEFICIENCY, FRACTURES): VITD: 12.15 ng/mL — ABNORMAL LOW (ref 30.00–100.00)

## 2023-12-27 LAB — LIPID PANEL
Cholesterol: 167 mg/dL (ref 0–200)
HDL: 47.7 mg/dL (ref >39.00)
LDL Cholesterol: 102 mg/dL — ABNORMAL HIGH (ref 0–99)
NonHDL: 119.73
Total CHOL/HDL Ratio: 4
Triglycerides: 87 mg/dL (ref 0.0–149.0)
VLDL: 17.4 mg/dL (ref 0.0–40.0)

## 2023-12-27 LAB — TSH: TSH: 1.14 u[IU]/mL (ref 0.35–5.50)

## 2023-12-27 MED ORDER — MECLIZINE HCL 12.5 MG PO TABS: 12.5000 mg | ORAL_TABLET | Freq: Two times a day (BID) | ORAL | 0 refills | Status: AC | PRN

## 2023-12-27 NOTE — Assessment & Plan Note (Addendum)
 Chronic stable. -Continue losartan /HCTZ 50 -12.5 mg daily. -Will check labs as outlined.   Orders:   CBC with Differential/Platelet   Comprehensive metabolic panel with GFR   Lipid panel   TSH

## 2023-12-27 NOTE — Assessment & Plan Note (Addendum)
 Lab Results  Component Value Date   HGBA1C 5.8 07/12/2023  - Will check A1c.   Orders:   HgB A1c

## 2023-12-27 NOTE — Assessment & Plan Note (Addendum)
 Chronic stable on medication.  - Continue gabapentin  300 mg at bed time.

## 2023-12-27 NOTE — Assessment & Plan Note (Addendum)
 Will check Vit D level.  Orders:   VITAMIN D  25 Hydroxy (Vit-D Deficiency, Fractures)

## 2023-12-27 NOTE — Progress Notes (Signed)
 Established Patient Office Visit  Subjective:  Patient ID: Stephanie Marsh, female    DOB: 1955/07/20  Age: 68 y.o. MRN: [id number]  CC:  Chief Complaint  Patient presents with   Medical Management of Chronic Issues   Discussed the use of AI scribe software for clinical note transcription with the patient, who gave verbal consent to proceed.  History of Present Illness   History of Present Illness   Stephanie Marsh is a 68 year old female who presents for a routine follow-up visit.  Her home blood pressure is around 118 to 120 mmHg. She takes gabapentin  for back pain with minimal relief. She takes an iron supplement that darkens her stools but does not cause constipation.  She stopped all vitamins about two months ago and is worried about low vitamin D .   She has occasional dizziness, mainly on cruises, and vertigo medication helps. She is planning a cruise in August.  She has never had a colonoscopy and is reluctant to pursue it. She has no family history of colon cancer and no bowel changes other than darker stools from iron.  She has no chest pain, shortness of breath, hematuria.  She eats and sleeps well and feels she gets enough exercise through two jobs.     Past Medical History:  Diagnosis Date   Anemia    Back pain    Hypertension    Vitamin D  deficiency disease     Past Surgical History:  Procedure Laterality Date   NO PAST SURGERIES      Family History  Problem Relation Age of Onset   Diabetes Mother    Hypertension Father     Social History   Socioeconomic History   Marital status: Married    Spouse name: Not on file   Number of children: Not on file   Years of education: Not on file   Highest education level: Not on file  Occupational History   Not on file  Tobacco Use   Smoking status: Former    Current packs/day: 0.00    Types: Cigarettes    Quit date: 6    Years since quitting: 31.9   Smokeless tobacco: Never  Vaping Use   Vaping  status: Never Used  Substance and Sexual Activity   Alcohol use: Never   Drug use: Never   Sexual activity: Yes  Other Topics Concern   Not on file  Social History Narrative   Not on file   Social Drivers of Health   Tobacco Use: Medium Risk (12/27/2023)   Patient History    Smoking Tobacco Use: Former    Smokeless Tobacco Use: Never    Passive Exposure: Not on Actuary Strain: Low Risk (03/07/2023)   Overall Financial Resource Strain (CARDIA)    Difficulty of Paying Living Expenses: Not hard at all  Food Insecurity: No Food Insecurity (03/07/2023)   Hunger Vital Sign    Worried About Running Out of Food in the Last Year: Never true    Ran Out of Food in the Last Year: Never true  Transportation Needs: No Transportation Needs (03/07/2023)   PRAPARE - Administrator, Civil Service (Medical): No    Lack of Transportation (Non-Medical): No  Physical Activity: Inactive (03/07/2023)   Exercise Vital Sign    Days of Exercise per Week: 0 days    Minutes of Exercise per Session: 0 min  Stress: No Stress Concern Present (03/07/2023)   [employer] of  Occupational Health - Occupational Stress Questionnaire    Feeling of Stress : Not at all  Social Connections: Moderately Isolated (03/07/2023)   Social Connection and Isolation Panel    Frequency of Communication with Friends and Family: Once a week    Frequency of Social Gatherings with Friends and Family: More than three times a week    Attends Religious Services: Never    Database Administrator or Organizations: No    Attends Banker Meetings: Never    Marital Status: Married  Catering Manager Violence: Not At Risk (03/07/2023)   Humiliation, Afraid, Rape, and Kick questionnaire    Fear of Current or Ex-Partner: No    Emotionally Abused: No    Physically Abused: No    Sexually Abused: No  Depression (PHQ2-9): Low Risk (12/27/2023)   Depression (PHQ2-9)    PHQ-2 Score: 0  Alcohol Screen:  Low Risk (03/07/2023)   Alcohol Screen    Last Alcohol Screening Score (AUDIT): 0  Housing: Unknown (03/07/2023)   Housing Stability Vital Sign    Unable to Pay for Housing in the Last Year: No    Number of Times Moved in the Last Year: Not on file    Homeless in the Last Year: No  Utilities: Not At Risk (03/07/2023)   [facility] Utilities    Threatened with loss of utilities: No  Health Literacy: Adequate Health Literacy (03/07/2023)   B1300 Health Literacy    Frequency of need for help with medical instructions: Never     Outpatient Medications Prior to Visit  Medication Sig Dispense Refill   ferrous sulfate  325 (65 FE) MG tablet Take 1 tablet (325 mg total) by mouth daily with breakfast. 30 tablet 3   losartan -hydrochlorothiazide  (HYZAAR) 50-12.5 MG tablet Take 1 tablet by mouth daily. 90 tablet 3   Vitamin D , Ergocalciferol , (DRISDOL ) 1.25 MG (50000 UNIT) CAPS capsule Take 1 capsule (50,000 Units total) by mouth every 7 (seven) days. 12 capsule 0   meclizine  (ANTIVERT ) 12.5 MG tablet TAKE 1 TABLET BY MOUTH THREE TIMES DAILY AS NEEDED FOR DIZZINESS 30 tablet 0   Vitamin D , Ergocalciferol , (DRISDOL ) 1.25 MG (50000 UNIT) CAPS capsule Take 1 capsule (50,000 Units total) by mouth every 7 (seven) days. (Patient not taking: Reported on 12/27/2023) 12 capsule 0   gabapentin  (NEURONTIN ) 300 MG capsule Take 1 capsule by mouth at bedtime 90 capsule 0   No facility-administered medications prior to visit.    Allergies[1]  ROS Review of Systems Negative unless indicated in HPI.    Objective:    Physical Exam Constitutional:      Appearance: Normal appearance.  HENT:     Mouth/Throat:     Mouth: Mucous membranes are moist.  Eyes:     Conjunctiva/sclera: Conjunctivae normal.     Pupils: Pupils are equal, round, and reactive to light.  Cardiovascular:     Rate and Rhythm: Normal rate and regular rhythm.     Pulses: Normal pulses.     Heart sounds: Normal heart sounds.  Pulmonary:      Effort: Pulmonary effort is normal.     Breath sounds: Normal breath sounds.  Abdominal:     General: Bowel sounds are normal.     Palpations: Abdomen is soft. There is no mass.     Tenderness: There is no guarding.  Musculoskeletal:     Cervical back: Normal range of motion. No tenderness.     Right lower leg: No edema.     Left  lower leg: No edema.  Skin:    General: Skin is warm.     Findings: No bruising.  Neurological:     General: No focal deficit present.     Mental Status: She is alert and oriented to person, place, and time. Mental status is at baseline.  Psychiatric:        Mood and Affect: Mood normal.        Behavior: Behavior normal.        Thought Content: Thought content normal.        Judgment: Judgment normal.     BP 118/76   Pulse 61   Temp 98.1 F (36.7 C)   Ht 4' 10.5 (1.486 m)   Wt 150 lb 6.4 oz (68.2 kg)   SpO2 99%   BMI 30.90 kg/m  Wt Readings from Last 3 Encounters:  12/27/23 150 lb 6.4 oz (68.2 kg)  07/12/23 149 lb 3.2 oz (67.7 kg)  03/07/23 160 lb (72.6 kg)     Health Maintenance  Topic Date Due   COVID-19 Vaccine (4 - 2025-26 season) 01/12/2024 (Originally 09/17/2023)   Zoster Vaccines- Shingrix (1 of 2) 03/26/2024 (Originally 05/12/2004)   Influenza Vaccine  04/15/2024 (Originally 08/17/2023)   Pneumococcal Vaccine: 50+ Years (1 of 1 - PCV) 12/26/2024 (Originally 05/12/2004)   Bone Density Scan  12/26/2024 (Originally 05/13/2019)   Colonoscopy  12/26/2024 (Originally 05/13/1999)   Hepatitis C Screening  12/26/2024 (Originally 05/12/1972)   Medicare Annual Wellness (AWV)  03/06/2024   Mammogram  03/26/2024   DTaP/Tdap/Td (2 - Td or Tdap) 01/16/2026   Meningococcal B Vaccine  Aged Out    There are no preventive care reminders to display for this patient.  Lab Results  Component Value Date   TSH 1.07 07/12/2023   Lab Results  Component Value Date   WBC 6.4 07/12/2023   HGB 11.4 (L) 07/12/2023   HCT 35.0 (L) 07/12/2023   MCV 85.4  07/12/2023   PLT 243.0 07/12/2023   Lab Results  Component Value Date   NA 139 07/12/2023   K 3.7 07/12/2023   CO2 29 07/12/2023   GLUCOSE 93 07/12/2023   BUN 18 07/12/2023   CREATININE 0.76 07/12/2023   BILITOT 0.3 07/12/2023   ALKPHOS 54 07/12/2023   AST 14 07/12/2023   ALT 12 07/12/2023   PROT 7.1 07/12/2023   ALBUMIN 4.1 07/12/2023   CALCIUM 9.4 07/12/2023   EGFR 96 06/01/2021   GFR 80.10 07/12/2023   Lab Results  Component Value Date   CHOL 185 07/12/2023   Lab Results  Component Value Date   HDL 47.60 07/12/2023   Lab Results  Component Value Date   LDLCALC 116 (H) 07/12/2023   Lab Results  Component Value Date   TRIG 107.0 07/12/2023   Lab Results  Component Value Date   CHOLHDL 4 07/12/2023   Lab Results  Component Value Date   HGBA1C 5.8 07/12/2023      Assessment & Plan:   Assessment & Plan Vitamin D  deficiency Will check Vit D level.  Orders:   VITAMIN D  25 Hydroxy (Vit-D Deficiency, Fractures)  Hypertension, unspecified type Chronic stable. -Continue losartan /HCTZ 50 -12.5 mg daily. -Will check labs as outlined.   Orders:   CBC with Differential/Platelet   Comprehensive metabolic panel with GFR   Lipid panel   TSH  Chronic right-sided low back pain without sciatica Chronic stable on medication.  - Continue gabapentin  300 mg at bed time.      Prediabetes  Lab Results  Component Value Date   HGBA1C 5.8 07/12/2023  - Will check A1c.   Orders:   HgB A1c   Assessment & Plan    Follow-up: Return in about 6 months (around 06/26/2024).   Chelsea Aurora, NP     [1] No Known Allergies

## 2023-12-27 NOTE — Assessment & Plan Note (Deleted)
 SABRA

## 2023-12-28 ENCOUNTER — Ambulatory Visit: Payer: Self-pay | Admitting: Nurse Practitioner

## 2023-12-28 LAB — HEMOGLOBIN A1C: Hgb A1c MFr Bld: 5.7 % (ref 4.6–6.5)

## 2023-12-28 MED ORDER — VITAMIN D (ERGOCALCIFEROL) 1.25 MG (50000 UNIT) PO CAPS: 50000.0000 [IU] | ORAL_CAPSULE | ORAL | 0 refills | Status: AC

## 2023-12-28 NOTE — Progress Notes (Signed)
 Please inform pt: Her Hb has improved from 11.4 to 11.8. Continue iron supplement.  Vitamin D  is low. I have sent 50,000 units of Vit D prescription to the pharmacy weekly for 12 weeks. After completing the prescription you can take take OTC 2000 units daily.   Hg A1c stable at 5.7  Electrolytes, liver, kidney and thyroid  normal.

## 2023-12-28 NOTE — Progress Notes (Signed)
 Noted

## 2024-03-11 ENCOUNTER — Ambulatory Visit: Payer: Medicare Other

## 2024-06-26 ENCOUNTER — Ambulatory Visit: Admitting: Nurse Practitioner
